# Patient Record
Sex: Male | Born: 1970 | Race: Black or African American | Hispanic: No | Marital: Married | Smoking: Never smoker
Health system: Southern US, Community
[De-identification: ages and names within clinical notes are randomized; demographics above are authoritative.]

## PROBLEM LIST (undated history)

## (undated) ENCOUNTER — Ambulatory Visit: Admission: EM | Payer: BC Managed Care – PPO

## (undated) DIAGNOSIS — Z9989 Dependence on other enabling machines and devices: Principal | ICD-10-CM

## (undated) DIAGNOSIS — G4733 Obstructive sleep apnea (adult) (pediatric): Secondary | ICD-10-CM

## (undated) DIAGNOSIS — I10 Essential (primary) hypertension: Secondary | ICD-10-CM

## (undated) DIAGNOSIS — E669 Obesity, unspecified: Secondary | ICD-10-CM

## (undated) HISTORY — DX: Essential (primary) hypertension: I10

## (undated) HISTORY — DX: Obstructive sleep apnea (adult) (pediatric): G47.33

## (undated) HISTORY — DX: Obesity, unspecified: E66.9

## (undated) HISTORY — DX: Dependence on other enabling machines and devices: Z99.89

## (undated) HISTORY — PX: NASAL SEPTUM SURGERY: SHX37

## (undated) HISTORY — PX: COLONOSCOPY: SHX174

---

## 2004-01-07 ENCOUNTER — Emergency Department (HOSPITAL_COMMUNITY): Admission: EM | Admit: 2004-01-07 | Discharge: 2004-01-07 | Payer: Self-pay | Admitting: Family Medicine

## 2004-02-07 ENCOUNTER — Emergency Department (HOSPITAL_COMMUNITY): Admission: EM | Admit: 2004-02-07 | Discharge: 2004-02-07 | Payer: Self-pay | Admitting: Emergency Medicine

## 2004-03-01 ENCOUNTER — Emergency Department (HOSPITAL_COMMUNITY): Admission: EM | Admit: 2004-03-01 | Discharge: 2004-03-01 | Payer: Self-pay | Admitting: Family Medicine

## 2004-08-10 ENCOUNTER — Ambulatory Visit (HOSPITAL_COMMUNITY): Admission: RE | Admit: 2004-08-10 | Discharge: 2004-08-10 | Payer: Self-pay | Admitting: Gastroenterology

## 2004-08-10 ENCOUNTER — Encounter (INDEPENDENT_AMBULATORY_CARE_PROVIDER_SITE_OTHER): Payer: Self-pay | Admitting: Specialist

## 2004-11-29 ENCOUNTER — Ambulatory Visit: Payer: Self-pay | Admitting: Internal Medicine

## 2004-11-29 ENCOUNTER — Ambulatory Visit (HOSPITAL_BASED_OUTPATIENT_CLINIC_OR_DEPARTMENT_OTHER): Admission: RE | Admit: 2004-11-29 | Discharge: 2004-11-29 | Payer: Self-pay | Admitting: Family Medicine

## 2006-10-12 ENCOUNTER — Ambulatory Visit: Payer: Self-pay | Admitting: Pulmonary Disease

## 2007-01-24 ENCOUNTER — Ambulatory Visit: Payer: Self-pay | Admitting: Pulmonary Disease

## 2007-07-09 DIAGNOSIS — I10 Essential (primary) hypertension: Secondary | ICD-10-CM | POA: Insufficient documentation

## 2007-07-09 DIAGNOSIS — G4733 Obstructive sleep apnea (adult) (pediatric): Secondary | ICD-10-CM

## 2007-08-21 ENCOUNTER — Ambulatory Visit: Payer: Self-pay | Admitting: Pulmonary Disease

## 2007-10-06 ENCOUNTER — Emergency Department (HOSPITAL_COMMUNITY): Admission: EM | Admit: 2007-10-06 | Discharge: 2007-10-07 | Payer: Self-pay | Admitting: Family Medicine

## 2009-05-26 ENCOUNTER — Ambulatory Visit: Payer: Self-pay | Admitting: Internal Medicine

## 2009-05-26 ENCOUNTER — Encounter (INDEPENDENT_AMBULATORY_CARE_PROVIDER_SITE_OTHER): Payer: Self-pay | Admitting: Otolaryngology

## 2009-05-26 ENCOUNTER — Ambulatory Visit (HOSPITAL_COMMUNITY): Admission: RE | Admit: 2009-05-26 | Discharge: 2009-05-27 | Payer: Self-pay | Admitting: Otolaryngology

## 2009-06-21 ENCOUNTER — Emergency Department (HOSPITAL_COMMUNITY): Admission: EM | Admit: 2009-06-21 | Discharge: 2009-06-21 | Payer: Self-pay | Admitting: Emergency Medicine

## 2009-07-01 ENCOUNTER — Observation Stay (HOSPITAL_COMMUNITY): Admission: EM | Admit: 2009-07-01 | Discharge: 2009-07-02 | Payer: Self-pay | Admitting: Emergency Medicine

## 2009-07-22 ENCOUNTER — Inpatient Hospital Stay (HOSPITAL_COMMUNITY): Admission: RE | Admit: 2009-07-22 | Discharge: 2009-07-24 | Payer: Self-pay | Admitting: Psychiatry

## 2009-07-22 ENCOUNTER — Ambulatory Visit: Payer: Self-pay | Admitting: Psychiatry

## 2009-07-26 ENCOUNTER — Other Ambulatory Visit (HOSPITAL_COMMUNITY): Admission: RE | Admit: 2009-07-26 | Discharge: 2009-10-24 | Payer: Self-pay | Admitting: Psychiatry

## 2009-07-26 ENCOUNTER — Ambulatory Visit: Payer: Self-pay | Admitting: Psychiatry

## 2009-09-24 ENCOUNTER — Emergency Department (HOSPITAL_COMMUNITY): Admission: EM | Admit: 2009-09-24 | Discharge: 2009-09-24 | Payer: Self-pay | Admitting: Family Medicine

## 2009-12-16 ENCOUNTER — Emergency Department (HOSPITAL_COMMUNITY): Admission: EM | Admit: 2009-12-16 | Discharge: 2009-12-16 | Payer: Self-pay | Admitting: Family Medicine

## 2010-08-11 ENCOUNTER — Ambulatory Visit (HOSPITAL_BASED_OUTPATIENT_CLINIC_OR_DEPARTMENT_OTHER): Payer: 59 | Attending: Otolaryngology

## 2010-08-11 DIAGNOSIS — G4733 Obstructive sleep apnea (adult) (pediatric): Secondary | ICD-10-CM | POA: Insufficient documentation

## 2010-08-14 DIAGNOSIS — G4733 Obstructive sleep apnea (adult) (pediatric): Secondary | ICD-10-CM

## 2010-08-28 LAB — BASIC METABOLIC PANEL
CO2: 25 mEq/L (ref 19–32)
Chloride: 108 mEq/L (ref 96–112)
Potassium: 3.3 mEq/L — ABNORMAL LOW (ref 3.5–5.1)

## 2010-08-28 LAB — POCT I-STAT, CHEM 8
Chloride: 105 mEq/L (ref 96–112)
Creatinine, Ser: 1.3 mg/dL (ref 0.4–1.5)
Glucose, Bld: 107 mg/dL — ABNORMAL HIGH (ref 70–99)
HCT: 36 % — ABNORMAL LOW (ref 39.0–52.0)
Hemoglobin: 12.2 g/dL — ABNORMAL LOW (ref 13.0–17.0)
Potassium: 3.5 mEq/L (ref 3.5–5.1)
Sodium: 143 mEq/L (ref 135–145)

## 2010-08-28 LAB — CBC
HCT: 24.7 % — ABNORMAL LOW (ref 39.0–52.0)
HCT: 29.9 % — ABNORMAL LOW (ref 39.0–52.0)
Hemoglobin: 8.1 g/dL — ABNORMAL LOW (ref 13.0–17.0)
Hemoglobin: 9.7 g/dL — ABNORMAL LOW (ref 13.0–17.0)
MCV: 80 fL (ref 78.0–100.0)
Platelets: 325 10*3/uL (ref 150–400)
RBC: 3.08 MIL/uL — ABNORMAL LOW (ref 4.22–5.81)
RBC: 3.74 MIL/uL — ABNORMAL LOW (ref 4.22–5.81)
WBC: 15.5 10*3/uL — ABNORMAL HIGH (ref 4.0–10.5)
WBC: 16.8 10*3/uL — ABNORMAL HIGH (ref 4.0–10.5)

## 2010-08-28 LAB — ABO/RH: ABO/RH(D): O POS

## 2010-08-28 LAB — DIFFERENTIAL
Eosinophils Absolute: 0.3 10*3/uL (ref 0.0–0.7)
Eosinophils Relative: 2 % (ref 0–5)
Lymphocytes Relative: 21 % (ref 12–46)
Lymphs Abs: 3.5 10*3/uL (ref 0.7–4.0)
Monocytes Absolute: 1.3 10*3/uL — ABNORMAL HIGH (ref 0.1–1.0)
Monocytes Relative: 8 % (ref 3–12)

## 2010-08-28 LAB — TYPE AND SCREEN: ABO/RH(D): O POS

## 2010-09-01 LAB — COMPREHENSIVE METABOLIC PANEL
Albumin: 3.8 g/dL (ref 3.5–5.2)
BUN: 8 mg/dL (ref 6–23)
Calcium: 9.5 mg/dL (ref 8.4–10.5)
Chloride: 106 mEq/L (ref 96–112)
Creatinine, Ser: 1.34 mg/dL (ref 0.4–1.5)
GFR calc non Af Amer: 60 mL/min — ABNORMAL LOW (ref >60)
Total Bilirubin: 0.8 mg/dL (ref 0.3–1.2)

## 2010-09-01 LAB — CBC
HCT: 30.8 % — ABNORMAL LOW (ref 39.0–52.0)
Hemoglobin: 9.9 g/dL — ABNORMAL LOW (ref 13.0–17.0)
MCV: 77.7 fL — ABNORMAL LOW (ref 78.0–100.0)
RDW: 18.6 % — ABNORMAL HIGH (ref 11.5–15.5)

## 2010-09-13 LAB — BASIC METABOLIC PANEL
Chloride: 100 mEq/L (ref 96–112)
GFR calc non Af Amer: >60 mL/min (ref >60)
Glucose, Bld: 94 mg/dL (ref 70–99)
Potassium: 4.3 mEq/L (ref 3.5–5.1)
Sodium: 138 mEq/L (ref 135–145)

## 2010-09-13 LAB — CBC
Hemoglobin: 13.8 g/dL (ref 13.0–17.0)
RBC: 5.45 MIL/uL (ref 4.22–5.81)

## 2010-10-28 NOTE — Procedures (Signed)
NAME:  [name], [name]                ACCOUNT NO.:  [id number]   MEDICAL RECORD NO.:  [id number]          PATIENT TYPE:  OUT   LOCATION:  SLEEP CENTER                 FACILITY:  [facility]   PHYSICIAN:  Clinton D. Maple Hudson, M.D. DATE OF BIRTH:  Nov 07, 1970   DATE OF STUDY:  11/29/2004                              NOCTURNAL POLYSOMNOGRAM   REFERRING PHYSICIAN:  Dr. Bryan Lemma. Ehinger.   INDICATION FOR STUDY:  Hypersomnia with sleep apnea.   EPWORTH SLEEPINESS SCORE:  11/24   BODY MASS INDEX:  45   WEIGHT:  327 pounds.   SLEEP ARCHITECTURE:  Total sleep time of 403 minutes with sleep efficiency  90%.  Stage I was 16%, stage II 46%, stages III and IV 2%, REM 35% of total  sleep time.  Sleep latency 2.5 minutes, REM latency 79 minutes, awake after  sleep onset 41 minutes, arousal index 22.  No bedtime medication was  reported.   RESPIRATORY DATA:   SPLIT-STUDY PROTOCOL:  Respiratory disturbance index (RDI, AHI) -- 81.8  obstructive events per hour, indicating severe obstructive sleep  apnea/hypopnea syndrome.  There were 48 obstructive apneas, 1 central apnea  and 133 hypopneas before CPAP.  Events were not positional.  CPAP was  titrated to 14 CWP, RDI of 4.2 per hour, using a small Respironics Comfort  Gel mask with heated humidifier.   OXYGEN DATA:  Loud snoring with oxygen desaturation to a nadir of 73% before  CPAP.  After CPAP control, saturation above 95% to 98% on room air.   CARDIAC DATA:  Normal sinus rhythm.   MOVEMENT/PARASOMNIA:  Occasional leg jerk with insignificant effect on  sleep.   IMPRESSION/RECOMMENDATION:  1.  Severe obstructive sleep apnea/hypopnea syndrome, respiratory      disturbance index 81.8 per hour with loud snoring and oxygen      desaturation to 73%.  2.  Successful continuous positive airway pressure titration to 14      centimeters of water pressure, respiratory disturbance index 4.2 per      hour, using a small Comfort Gel mask with heated  humidifier.      Clinton D. Maple Hudson, M.D.  Diplomat    CDY/MEDQ  D:  12/04/2004 14:25:39  T:  12/05/2004 06:18:18  Job:  [id number]

## 2010-10-28 NOTE — Op Note (Signed)
[name], [name]                ACCOUNT NO.:  [id number]   MEDICAL RECORD NO.:  [id number]          PATIENT TYPE:  AMB   LOCATION:                               FACILITY:  [facility]   PHYSICIAN:  Graylin Shiver, M.D.   DATE OF BIRTH:  04/03/1971   DATE OF PROCEDURE:  08/10/2004  DATE OF DISCHARGE:                                 OPERATIVE REPORT   INDICATIONS:  Rectal bleeding.   INFORMED CONSENT:  Informed consent was obtained after explanation of the  risks of bleeding, infection and perforation.   PREMEDICATION:  Fentanyl 100 mcg IV, Versed 8 mg IV.   PROCEDURE:  With the patient in the left lateral decubitus position, a  rectal exam was performed. No masses were felt. The Olympus colonoscope was  inserted into the rectum and advanced around the colon to the cecum. Cecal  landmarks were identified.  The cecum and ascending colon were normal, the  transverse colon normal, the descending colon normal.  In the sigmoid, there  was a small 3 mm polyp biopsied off with cold forceps. The rectum was  normal. The scope was retroflexed in the rectum. No abnormalities were seen.  The scope was straightened and brought out. The patient tolerated the  procedure well without complications.   IMPRESSION:  Small sigmoid polyp.   PLAN:  The pathology will be checked.   I suspect that this patient's intermittent rectal bleeding is secondary to  some irritation around the anal area associated with hard bowel movements.  I see no specific lesions at this time.  I would recommend that he use a  fiber supplement such as Metamucil to see if this helps.       ___________________________________________  Graylin Shiver, M.D.    SFG/MEDQ  D:  08/10/2004  T:  08/10/2004  Job:  [id number]   cc:   Kyung Rudd  [address]  [place]  [place] [address]  Fax: [phone]

## 2010-10-28 NOTE — Assessment & Plan Note (Signed)
[facility]                             PULMONARY OFFICE NOTE   DARIEN, KADING                         MRN:          [id number]  DATE:10/12/2006                            DOB:          1971/06/01    SLEEP MEDICINE CONSULTATION   HISTORY OF PRESENT ILLNESS:  The patient is a 40 year old gentleman whom  I have been asked to see for management of obstructive sleep apnea.  The  patient was diagnosed with severe sleep apnea in 2006 when he was found  to have 82 events per hour and optimal CPAP at 14 cm.  Since that time,  the patient has used CPAP off and on and is complaining of too high a  pressure and a feeling of suffocation.  Recently, he has had his  pressure decreased with some improvement, but it remains quite difficult  for him.  The patient still awakens during the night and will rip the  mask off, and feels that he is intolerant to it.  The patient currently  uses a full face mask and does admit to possible mouth opening.  He has  a heated humidifier and knows how to use it.  He typically gets to bed  at various hours, but usually around 12 midnight.  He gets up at 6:30 to  start his day.  He has been noted to have snoring and pauses in his  breathing during his sleep when he does not wear CPAP.  The patient  feels fairly rested in the morning and denies any inappropriate daytime  sleepiness.  However, the patient works for [employer] and stays moving  continually throughout the day.  He denies dozing with movies or TV and  denies any sleepiness with driving.  He does state that his weight is up  about 40 pounds over the last few years.   PAST MEDICAL HISTORY:  1. Significant for hypertension.  2. Sleep apnea, as stated above.  Otherwise, noncontributory.   MEDICATIONS:  Hydrochlorothiazide 25 mg daily.   He has no known drug allergies.   SOCIAL HISTORY:  He is married.  He has never smoked.   FAMILY HISTORY:  Noncontributory in first  degree relatives.   REVIEW OF SYSTEMS:  As per history of present illness.  Also see patient  intake form documented in the chart.   PHYSICAL EXAM:  GENERAL:  He is a morbidly obese male in no acute  distress.  Blood pressure is 148/98, pulse 58, temperature 98.2, weight 314 pounds,  O2 saturation on room air is 95%.  HEENT:  Pupils are equal, round, and reactive to light and  accommodation.  Extraocular muscles are intact.  Nares show turbinate  hypertrophy with severe elongation of the soft palate and uvula, and  tonsillar hypertrophy as well.  NECK:  Supple without JVD or lymphadenopathy.  There is no palpable  thyromegaly.  CHEST:  Totally clear.  CARDIAC:  Regular rate and rhythm.  No murmurs, rubs, or gallops.  ABDOMEN:  Soft and nontender.  Good bowel sounds.  GENITAL, RECTAL, AND BREASTS:  Not done  and not indicated.  LOWER EXTREMITIES:  Trace edema.  Pulses are intact distally.  NEUROLOGIC:  He is alert and oriented.  No obvious motor deficits.   IMPRESSION:  Severe obstructive sleep apnea with poor tolerance of  continuous positive airway pressure.  I wonder if he would do better  with a change to BiPAP to help him with his smothering sensation.  I  also think changing from a full face mask to nasal pillows with a chin  cuff would also help.  The patient feels that he is not symptomatic  during the day, but I have explained to him that with this severity of  sleep apnea, that he is at very high risk for cardiovascular  complications moving forward.  The patient is agreeable to trying this.   PLAN:  1. Will change to BiPAP with an inspiratory pressure of 12 and an      expiratory pressure of 8.  Obviously, this is not going to be high      enough for him, but will try to improve his tolerance and do      desensitization.  2. Change to Respironics OptiLife nasal pillows.  3. The patient will follow up in 4 weeks or sooner if there are      problems.     Barbaraann Share, MD,FCCP  Electronically Signed    [facility]/MedQ  DD: 10/27/2006  DT: 10/27/2006  Job #: [id number]   cc:   Bryan Lemma. Manus Gunning, M.D.

## 2010-11-25 ENCOUNTER — Other Ambulatory Visit (INDEPENDENT_AMBULATORY_CARE_PROVIDER_SITE_OTHER): Payer: Self-pay | Admitting: General Surgery

## 2010-11-28 ENCOUNTER — Other Ambulatory Visit (INDEPENDENT_AMBULATORY_CARE_PROVIDER_SITE_OTHER): Payer: Self-pay | Admitting: General Surgery

## 2010-11-28 LAB — LIPID PANEL
Cholesterol: 161 mg/dL (ref 0–200)
Total CHOL/HDL Ratio: 4.6 Ratio

## 2010-11-28 LAB — COMPREHENSIVE METABOLIC PANEL
ALT: 20 U/L (ref 0–53)
BUN: 15 mg/dL (ref 6–23)
CO2: 27 mEq/L (ref 19–32)
Calcium: 10 mg/dL (ref 8.4–10.5)
Chloride: 102 mEq/L (ref 96–112)
Creat: 1.27 mg/dL (ref 0.50–1.35)

## 2010-11-28 LAB — TSH: TSH: 1.569 u[IU]/mL (ref 0.350–4.500)

## 2010-11-29 LAB — CBC WITH DIFFERENTIAL/PLATELET
Eosinophils Relative: 4 % (ref 0–5)
HCT: 45.1 % (ref 39.0–52.0)
Lymphocytes Relative: 15 % (ref 12–46)
Lymphs Abs: 1.9 10*3/uL (ref 0.7–4.0)
MCV: 74.3 fL — ABNORMAL LOW (ref 78.0–100.0)
Monocytes Absolute: 1.1 10*3/uL — ABNORMAL HIGH (ref 0.1–1.0)
Monocytes Relative: 8 % (ref 3–12)
RBC: 6.07 MIL/uL — ABNORMAL HIGH (ref 4.22–5.81)
WBC: 13.2 10*3/uL — ABNORMAL HIGH (ref 4.0–10.5)

## 2010-11-30 ENCOUNTER — Ambulatory Visit (HOSPITAL_COMMUNITY)
Admission: RE | Admit: 2010-11-30 | Discharge: 2010-11-30 | Disposition: A | Discharge status: Home / Self Care | Payer: 59 | Source: Ambulatory Visit | Attending: General Surgery | Admitting: General Surgery

## 2010-11-30 DIAGNOSIS — Z01812 Encounter for preprocedural laboratory examination: Secondary | ICD-10-CM | POA: Insufficient documentation

## 2010-11-30 DIAGNOSIS — Z01818 Encounter for other preprocedural examination: Secondary | ICD-10-CM | POA: Insufficient documentation

## 2010-11-30 DIAGNOSIS — N200 Calculus of kidney: Secondary | ICD-10-CM | POA: Insufficient documentation

## 2010-11-30 DIAGNOSIS — Z01810 Encounter for preprocedural cardiovascular examination: Secondary | ICD-10-CM | POA: Insufficient documentation

## 2010-12-12 ENCOUNTER — Ambulatory Visit: Payer: 59 | Admitting: *Deleted

## 2010-12-13 ENCOUNTER — Encounter: Payer: 59 | Attending: General Surgery | Admitting: *Deleted

## 2010-12-13 DIAGNOSIS — Z01818 Encounter for other preprocedural examination: Secondary | ICD-10-CM | POA: Insufficient documentation

## 2010-12-13 DIAGNOSIS — Z713 Dietary counseling and surveillance: Secondary | ICD-10-CM | POA: Insufficient documentation

## 2011-02-16 ENCOUNTER — Encounter: Payer: 59 | Attending: General Surgery | Admitting: *Deleted

## 2011-02-16 DIAGNOSIS — Z09 Encounter for follow-up examination after completed treatment for conditions other than malignant neoplasm: Secondary | ICD-10-CM | POA: Insufficient documentation

## 2011-02-16 DIAGNOSIS — Z9884 Bariatric surgery status: Secondary | ICD-10-CM | POA: Insufficient documentation

## 2011-02-16 DIAGNOSIS — Z713 Dietary counseling and surveillance: Secondary | ICD-10-CM | POA: Insufficient documentation

## 2011-02-16 NOTE — Progress Notes (Signed)
  Pre-Operative Nutrition Class  Patient was seen on 02/16/2011 from 5:00pm to 6:00pm for Pre-Operative Nutrition education at [facility].   Surgery date: 03/07/11 Surgery type: LAGB  Weight today: 375.7 lb Weight change: 7.5 lbs gain since 12/13/10 Total weight lost: n/a BMI: 51.1  Samples given per MNT protocol: Bariatric Advantage Multivitamin Lot # [id number] Exp:12/12  Bariatric Advantage Calcium Citrate Lot # [id number] Exp:12/13  Celebrate Vitamins Multivitamin Lot # [id number] Exp: 4/13  Celebrate VitaminsCalcium Citrate Lot # [id number] Exp: 4/13  Corliss MarcusToni Arthurs Lot #: [id number] Exp: 1/14  The following the learning objective met by the patient during this course:   Identifies Pre-Op Dietary Goals and will begin 2 weeks pre-operatively   Identifies appropriate sources of fluids and proteins   States protein recommendations and appropriate sources pre and post-operatively  Identifies Post-Operative Dietary Goals and will follow for 2 weeks post-operatively  Identifies appropriate multivitamin and calcium sources  Describes the need for physical activity post-operatively and will follow MD recommendations  States when to call healthcare provider regarding medication questions or post-operative complications  Handouts given during class include:  Pre-Op Bariatric Surgery Diet Handout  Protein Shake Handout  Post-Op Bariatric Surgery Nutrition Handout  BELT Program Information Flyer  Support Group Information Flyer  Follow-Up Plan: Patient will follow-up at [facility] 2 weeks post operatively for diet advancement per MD.

## 2011-02-16 NOTE — Patient Instructions (Signed)
Follow:    Pre-Op Diet per MD 2 weeks prior to surgery  Phase 2- Liquids (clear/full) 2 weeks after surgery  Vitamin/Mineral/Calcium guidelines for purchasing bariatric supplements  Exercise guidelines pre and post-op per MD  Follow-up at [facility] in 2 weeks post-op for diet advancement. Contact Orit Sanville as needed with questions/concerns.  

## 2011-02-17 ENCOUNTER — Encounter (INDEPENDENT_AMBULATORY_CARE_PROVIDER_SITE_OTHER): Payer: Self-pay | Admitting: General Surgery

## 2011-02-17 ENCOUNTER — Ambulatory Visit (INDEPENDENT_AMBULATORY_CARE_PROVIDER_SITE_OTHER): Payer: 59 | Admitting: General Surgery

## 2011-02-17 NOTE — Patient Instructions (Signed)
Follow your preop diet closely. Call us for any questions or concerns.

## 2011-02-17 NOTE — Progress Notes (Signed)
Patient returns for his preop visit prior to planned LAP-BAND placement. We reviewed his workup. There were no concerns on-site or nutrition evaluation. Upper GI series showed a couple of renal stones but no hiatal hernia or other gastric abnormality. Lab work showed a mildly elevated white blood count of 13,000 but otherwise negative. He has not had any fever or constitutional symptoms.  All his questions and his wife's questions were answered and is reviewed the consent form. He encouraged to stay closely to the preop diet. He is ready for surgery later this month.

## 2011-02-26 ENCOUNTER — Observation Stay (HOSPITAL_COMMUNITY)
Admission: EM | Admit: 2011-02-26 | Discharge: 2011-02-26 | Disposition: A | Discharge status: Home / Self Care | Payer: 59 | Attending: Emergency Medicine | Admitting: Emergency Medicine

## 2011-02-26 DIAGNOSIS — G4730 Sleep apnea, unspecified: Secondary | ICD-10-CM | POA: Insufficient documentation

## 2011-02-26 DIAGNOSIS — I10 Essential (primary) hypertension: Secondary | ICD-10-CM | POA: Insufficient documentation

## 2011-02-26 DIAGNOSIS — N200 Calculus of kidney: Principal | ICD-10-CM | POA: Insufficient documentation

## 2011-02-26 DIAGNOSIS — R112 Nausea with vomiting, unspecified: Secondary | ICD-10-CM | POA: Insufficient documentation

## 2011-02-26 LAB — URINALYSIS, ROUTINE W REFLEX MICROSCOPIC
Glucose, UA: NEGATIVE mg/dL
Leukocytes, UA: NEGATIVE
Nitrite: NEGATIVE
Protein, ur: 100 mg/dL — AB

## 2011-02-26 LAB — POCT I-STAT, CHEM 8
BUN: 24 mg/dL — ABNORMAL HIGH (ref 6–23)
Calcium, Ion: 1.13 mmol/L (ref 1.12–1.32)
Chloride: 102 mEq/L (ref 96–112)

## 2011-02-26 LAB — URINE MICROSCOPIC-ADD ON

## 2011-03-03 ENCOUNTER — Other Ambulatory Visit (INDEPENDENT_AMBULATORY_CARE_PROVIDER_SITE_OTHER): Payer: Self-pay | Admitting: General Surgery

## 2011-03-03 ENCOUNTER — Encounter (HOSPITAL_COMMUNITY): Payer: 59

## 2011-03-03 LAB — DIFFERENTIAL
Basophils Absolute: 0 10*3/uL (ref 0.0–0.1)
Basophils Relative: 0 % (ref 0–1)
Eosinophils Absolute: 0.6 10*3/uL (ref 0.0–0.7)
Eosinophils Relative: 5 % (ref 0–5)
Lymphocytes Relative: 16 % (ref 12–46)

## 2011-03-03 LAB — SURGICAL PCR SCREEN
MRSA, PCR: NEGATIVE
Staphylococcus aureus: POSITIVE — AB

## 2011-03-03 LAB — COMPREHENSIVE METABOLIC PANEL
ALT: 25 U/L (ref 0–53)
AST: 21 U/L (ref 0–37)
Albumin: 3.8 g/dL (ref 3.5–5.2)
Alkaline Phosphatase: 63 U/L (ref 39–117)
Chloride: 99 mEq/L (ref 96–112)
Potassium: 3.6 mEq/L (ref 3.5–5.1)
Sodium: 137 mEq/L (ref 135–145)
Total Bilirubin: 0.5 mg/dL (ref 0.3–1.2)
Total Protein: 8 g/dL (ref 6.0–8.3)

## 2011-03-03 LAB — CBC
HCT: 40 % (ref 39.0–52.0)
Platelets: 343 10*3/uL (ref 150–400)
RDW: 17.2 % — ABNORMAL HIGH (ref 11.5–15.5)
WBC: 10.3 10*3/uL (ref 4.0–10.5)

## 2011-03-06 ENCOUNTER — Other Ambulatory Visit (INDEPENDENT_AMBULATORY_CARE_PROVIDER_SITE_OTHER): Payer: Self-pay | Admitting: General Surgery

## 2011-03-06 DIAGNOSIS — R899 Unspecified abnormal finding in specimens from other organs, systems and tissues: Secondary | ICD-10-CM

## 2011-03-06 LAB — BASIC METABOLIC PANEL
Calcium: 9.9 mg/dL (ref 8.4–10.5)
Creat: 1.8 mg/dL — ABNORMAL HIGH (ref 0.50–1.35)

## 2011-03-07 ENCOUNTER — Ambulatory Visit (HOSPITAL_COMMUNITY)
Admission: RE | Admit: 2011-03-07 | Discharge: 2011-03-08 | Disposition: A | Discharge status: Home / Self Care | Payer: 59 | Source: Ambulatory Visit | Attending: General Surgery | Admitting: General Surgery

## 2011-03-07 DIAGNOSIS — Z01812 Encounter for preprocedural laboratory examination: Secondary | ICD-10-CM | POA: Insufficient documentation

## 2011-03-07 DIAGNOSIS — N200 Calculus of kidney: Secondary | ICD-10-CM | POA: Insufficient documentation

## 2011-03-07 DIAGNOSIS — Z6841 Body Mass Index (BMI) 40.0 and over, adult: Secondary | ICD-10-CM | POA: Insufficient documentation

## 2011-03-07 DIAGNOSIS — I10 Essential (primary) hypertension: Secondary | ICD-10-CM

## 2011-03-07 DIAGNOSIS — G4733 Obstructive sleep apnea (adult) (pediatric): Secondary | ICD-10-CM

## 2011-03-07 HISTORY — PX: LAPAROSCOPIC GASTRIC BANDING: SHX1100

## 2011-03-07 LAB — DIFFERENTIAL
Basophils Absolute: 0.1
Eosinophils Relative: 0
Lymphocytes Relative: 6 — ABNORMAL LOW
Lymphs Abs: 1.3
Monocytes Absolute: 0.6

## 2011-03-07 LAB — COMPREHENSIVE METABOLIC PANEL
AST: 24
Albumin: 3.8
Chloride: 101
Creatinine, Ser: 1.72 — ABNORMAL HIGH
GFR calc Af Amer: 55 — ABNORMAL LOW
Potassium: 3.8
Sodium: 139
Total Bilirubin: 0.3

## 2011-03-07 LAB — CBC
MCV: 79.3
Platelets: 388
WBC: 20 — ABNORMAL HIGH

## 2011-03-08 ENCOUNTER — Ambulatory Visit (HOSPITAL_COMMUNITY): Payer: 59

## 2011-03-08 LAB — DIFFERENTIAL
Eosinophils Relative: 3 % (ref 0–5)
Lymphocytes Relative: 13 % (ref 12–46)
Lymphs Abs: 1.4 10*3/uL (ref 0.7–4.0)
Monocytes Absolute: 1 10*3/uL (ref 0.1–1.0)

## 2011-03-08 LAB — BASIC METABOLIC PANEL
BUN: 16 mg/dL (ref 6–23)
Creatinine, Ser: 1.84 mg/dL — ABNORMAL HIGH (ref 0.50–1.35)
GFR calc Af Amer: 50 mL/min — ABNORMAL LOW (ref >60)
GFR calc non Af Amer: 41 mL/min — ABNORMAL LOW (ref >60)

## 2011-03-08 LAB — CBC
HCT: 37.1 % — ABNORMAL LOW (ref 39.0–52.0)
MCH: 23.5 pg — ABNORMAL LOW (ref 26.0–34.0)
MCHC: 31.3 g/dL (ref 30.0–36.0)
MCV: 75.3 fL — ABNORMAL LOW (ref 78.0–100.0)
RDW: 16.9 % — ABNORMAL HIGH (ref 11.5–15.5)

## 2011-03-13 NOTE — Op Note (Signed)
NAMEBRYNDEN, [name]                ACCOUNT NO.:  [id number]  MEDICAL RECORD NO.:  [id number]  LOCATION:  [facility]                         FACILITY:  [facility]  PHYSICIAN:  Sharlet Salina T. Shervon Kerwin, M.D.DATE OF BIRTH:  12-02-1970  DATE OF PROCEDURE:  03/07/2011 DATE OF DISCHARGE:                              OPERATIVE REPORT   PREOPERATIVE DIAGNOSIS:  Morbid obesity.  POSTOPERATIVE DIAGNOSIS:  Morbid obesity.  SURGICAL PROCEDURES:  Placement of laparoscopic adjustable gastric band.  SURGEON:  Lorne Skeens. Aalani Aikens, M.D.  ASSISTANT:  Thornton Park. Daphine Deutscher, MD  ANESTHESIA:  General.  BRIEF HISTORY:  This patient is a 40 year old male with a progressive history of morbid obesity, unresponsive to multiple attempts at medical management.  He presents at 6 feet, 368 pounds, BMI of 50, and comorbidities of obstructive sleep apnea and hypertension, on 2 medications.  After extensive preoperative discussion regarding alternatives, risks, and nature of the procedure detailed elsewhere, we have elected to proceed with placement of laparoscopic adjustable gastric band for treatment of his morbid obesity.  DESCRIPTION OF OPERATION:  The patient was brought to operating room, placed in supine position on the operating table and general orotracheal anesthesia was induced.  He received preoperative IV antibiotics. Heparin was given subcutaneously preoperatively.  The abdomen was widely sterilely prepped and draped, and correct patient and procedure were verified.  Local anesthesia was used to infiltrate the trocar sites. Access was obtained with a 11 mm OptiVu trocar in the left subcostal space without difficulty and pneumoperitoneum established.  There was no evidence of trocar injury.  Under direct vision, a 15 mm trocar was placed laterally in the right upper quadrant, a 11 mm trocar in the right upper quadrant more medially, and 11 mm trocar above the left umbilicus for the camera port, and a 5 mm  trocar laterally in the left upper quadrant.  Through a 5 mm subxiphoid site, the Nathanson retractor was placed, and left lobe of liver elevated with good exposure of the hiatus and upper stomach.  Initially, the left crus and angle of His were exposed.  The peritoneum overlying the left crus was incised and careful blunt dissection carried back down along the left crus toward the retrogastric area.  The gastrohepatic omentum was opened along a relatively clear area, although this was very fatty.  The right crus was then exposed and the base of the right crus at the area of crossing fat was identified.  The peritoneum here was incised and the finger retractor was then inserted into this space, and passed retrogastric without difficulty and deployed up through the previously dissected area of the angle of His without difficulty.  An AP large flush band system was introduced, tubing placed and the finger retractor brought back behind the stomach, and then the band brought through the retrogastric tunnel without difficulty.  With the sizing tube in place, the band was buckled with no undue tension and the sizing tube removed.  Holding the tubing toward the patient's feet, the fundus was then imbricated up over the band to the small gastric pouch with 3 interrupted 2-0 Ethibond sutures.  The band appeared to be in excellent position.  There was no  bleeding or evidence of intestinal or trocar injury.  The Nathanson retractor was removed.  The tubing was brought out through the right mid abdominal port, and then all CO2 was evacuated and trocars removed.  The right mid abdominal incision was lengthened slightly and subcutaneous pocket created.  The tubing was cut and attached to the port to which a piece of Prolene mesh was sutured to the back and the port was placed subcutaneously just lateral to the incision with the tubing curving smoothly into the abdomen.  This site was closed with a  running subcutaneous 3-0 Vicryl, and all skin incisions closed with subcuticular Monocryl and Dermabond.  Sponge and needle counts correct.  The patient taken to recovery in good condition.     Lorne Skeens. Rufino Staup, M.D.     Tory Emerald  D:  03/07/2011  T:  03/07/2011  Job:  [id number]  Electronically Signed by Glenna Fellows M.D. on 03/13/2011 01:36:31 PM

## 2011-03-21 ENCOUNTER — Encounter: Payer: 59 | Attending: General Surgery

## 2011-03-21 DIAGNOSIS — Z01818 Encounter for other preprocedural examination: Secondary | ICD-10-CM | POA: Insufficient documentation

## 2011-03-21 DIAGNOSIS — Z713 Dietary counseling and surveillance: Secondary | ICD-10-CM | POA: Insufficient documentation

## 2011-03-21 NOTE — Patient Instructions (Signed)
Patient to follow Phase 3A-Soft, High Protein Diet and follow-up at [facility] in 6 weeks for 2 months post-op nutrition visit for diet advancement. 

## 2011-03-21 NOTE — Progress Notes (Signed)
  2 Week Post-Operative Nutrition Class  Patient was seen on 03/21/2011 for Post-Operative Nutrition education from 1600 to 1635 at the [facility].   Surgery date: 03/07/11  Surgery type: LAGB   Weight today: 334.2 Weight change: 41.5 lbs BMI: 45.4%  The following learning objectives were reviewed in this course:   Identifies Phase 3A (Soft, High Proteins) Dietary Goals and will begin from 2 weeks post-operatively to 2 months post-operatively   Identifies appropriate sources of fluids and proteins   States protein recommendations and appropriate sources post-operatively  Identifies the need for appropriate texture modifications, mastication, and bite sizes when consuming solids  Identifies appropriate multivitamin and calcium sources post-operatively   BMI: 51.1Describes the need for physical activity post-operatively and will follow MD recommendations  States when to call healthcare provider regarding medication questions or post-operative complications  Handouts given during class include:  Phase 3A: Soft, High Protein Diet Handout  Band Fill Guidelines Handout  Follow-Up Plan: Patient will follow-up at [facility] in 6 weeks for 2 months post-op nutrition visit for diet advancement per MD.

## 2011-03-24 ENCOUNTER — Encounter (INDEPENDENT_AMBULATORY_CARE_PROVIDER_SITE_OTHER): Payer: Self-pay | Admitting: General Surgery

## 2011-03-24 ENCOUNTER — Ambulatory Visit (INDEPENDENT_AMBULATORY_CARE_PROVIDER_SITE_OTHER): Payer: 59 | Admitting: General Surgery

## 2011-03-24 VITALS — BP 122/78 | HR 60 | Temp 96.8°F | Resp 16 | Ht 71.75 in | Wt 341.2 lb

## 2011-03-24 DIAGNOSIS — Z9889 Other specified postprocedural states: Secondary | ICD-10-CM

## 2011-03-24 NOTE — Patient Instructions (Signed)
Work toward 40 minutes of exercise 4 times per week. Practice eating small bites very slowly. Did not preclude [place] for 30 minutes before or one hour after eating.

## 2011-03-24 NOTE — Progress Notes (Signed)
Patient returns to weeks following placement of lap band. He is doing well without complaints. He is tolerating a solid foods and does feel some early satiety.  On examination his weight is 341 pounds which is down 3 pounds from preop and down over 30 pounds from his original presentation. Abdomen is soft and nontender incisions all healing well.  Assessment and plan: Doing well following lab and placed without complication. Review diet and exercise strategies appeared return in 2-3 weeks for an initial fill

## 2011-03-30 ENCOUNTER — Encounter (INDEPENDENT_AMBULATORY_CARE_PROVIDER_SITE_OTHER): Payer: Self-pay | Admitting: General Surgery

## 2011-04-14 ENCOUNTER — Ambulatory Visit (INDEPENDENT_AMBULATORY_CARE_PROVIDER_SITE_OTHER): Payer: 59 | Admitting: General Surgery

## 2011-04-14 ENCOUNTER — Encounter (INDEPENDENT_AMBULATORY_CARE_PROVIDER_SITE_OTHER): Payer: Self-pay | Admitting: General Surgery

## 2011-04-14 NOTE — Progress Notes (Signed)
Patient returns just over one month following lap band placement. He is getting along well without complaint. He is not feeling much restriction if any at this point.  On exam his weight is stable at 341 pounds. Abdomen is soft and nontender port site looks fine.  With this information we went ahead with an initial fill of 2.0 cc finding 1.5 cc in his band for a total of 3.5 cc. He was able to tolerate water well. We discussed diet and exercise strategies. Return in 4 weeks.

## 2011-04-14 NOTE — Patient Instructions (Signed)
Concentrate on eating slowly with small bites. Protein shakes only this weekend.

## 2011-05-02 ENCOUNTER — Encounter: Payer: 59 | Attending: General Surgery | Admitting: *Deleted

## 2011-05-02 ENCOUNTER — Encounter: Payer: Self-pay | Admitting: *Deleted

## 2011-05-02 DIAGNOSIS — Z01818 Encounter for other preprocedural examination: Secondary | ICD-10-CM | POA: Insufficient documentation

## 2011-05-02 DIAGNOSIS — Z713 Dietary counseling and surveillance: Secondary | ICD-10-CM | POA: Insufficient documentation

## 2011-05-02 NOTE — Progress Notes (Signed)
  Follow-up visit: 8 Weeks Post-Operative LAGB Surgery  Medical Nutrition Therapy:  Appt start time: 1455 end time:  1530.  Assessment:  Primary concerns today: post-operative bariatric surgery nutrition management. [name] reports that he is doing better with portion control since his last band fill on 11/2. He continues to eat larger portions yet hopes for his 2nd fill soon.  Weight today: 341 lbs Weight change: 6.8 lb gain Total weight lost: 34.7 lbs total lost BMI: 46.3 Weight goal: 225 lbs  Surgery date: 03/07/11 Start weight at [facility]: 375.7 lbs  24-hr recall:  B (7 AM): cheese stick, Protein Shake (1) Snk (AM): No snack   L (10 AM): Protein shake (1) Snk (2 PM): Cheese stick, fruit snack  D (6 PM): Chicken (4 oz), green beans Snk (9-10 PM): SF icee or SF popsickle  Fluid intake: water, crystal light, protein shake = 64 oz Estimated total protein intake: 75-80g protein  Medications: No changes Supplementation: Taking supplements on a strict routine  Using straws: No Drinking while eating: No Hair loss: No Carbonated beverages: No N/V/D/C: None reported; Occasional diarrhea Last Lap-Band fill: Pt has had one band fill (1.5 cc) per Dr. Johna Sheriff  Recent physical activity:  Walking, 3 times/week, 45-60 minutes  Progress Towards Goal(s):  In progress.  Handouts given during visit include:  Phase 3B - High Protein and Non-starchy vegetables   Nutritional Diagnosis:  [place]-3.3 Overweight/obesity As related to recent LAGB surgery.  As evidenced by pt continuing to follow LAGB nutrition guidelines for continued weight loss.    Intervention:  Nutrition education.  Monitoring/Evaluation:  Dietary intake, exercise, lap band fills, and body weight. Follow up in 6-8 months for 3-4 month post-op visit.

## 2011-05-02 NOTE — Patient Instructions (Signed)
Goals:  Follow Phase 3B: High Protein + Non-Starchy Vegetables  Eat 3-6 small meals/snacks, every 3-5 hrs  Increase lean protein foods to meet 80-120g goal  Increase fluid intake to 64oz +  Avoid drinking 15 minutes before, during and 30 minutes after eating  Aim for >30 min of physical activity daily

## 2011-05-19 ENCOUNTER — Encounter (INDEPENDENT_AMBULATORY_CARE_PROVIDER_SITE_OTHER): Payer: 59 | Admitting: General Surgery

## 2011-05-26 ENCOUNTER — Encounter (INDEPENDENT_AMBULATORY_CARE_PROVIDER_SITE_OTHER): Payer: Self-pay | Admitting: General Surgery

## 2011-06-20 ENCOUNTER — Ambulatory Visit: Payer: 59 | Admitting: *Deleted

## 2011-08-24 ENCOUNTER — Ambulatory Visit (INDEPENDENT_AMBULATORY_CARE_PROVIDER_SITE_OTHER): Payer: 59 | Admitting: Physician Assistant

## 2011-08-24 ENCOUNTER — Encounter (INDEPENDENT_AMBULATORY_CARE_PROVIDER_SITE_OTHER): Payer: Self-pay

## 2011-08-24 VITALS — BP 156/104 | HR 76 | Temp 97.2°F | Resp 18 | Ht 71.75 in | Wt 361.6 lb

## 2011-08-24 DIAGNOSIS — Z4651 Encounter for fitting and adjustment of gastric lap band: Secondary | ICD-10-CM

## 2011-08-24 NOTE — Progress Notes (Signed)
  HISTORY: [name] is a 41 y.o.male who received an AP-Large lap-band in September 2012 by Dr. Johna Sheriff. He comes in having last been seen in November 2012 and has unfortunately gained 20 lbs.  He has increased hunger and portion sizes but no untoward symptoms. He'd like a fill today.  VITAL SIGNS: Filed Vitals:   08/24/11 0906  BP: 156/104  Pulse: 76  Temp: 97.2 F (36.2 C)  Resp: 18    PHYSICAL EXAM: Physical exam reveals a very well-appearing 40 y.o.male in no apparent distress Neurologic: Awake, alert, oriented Psych: Bright affect, conversant Respiratory: Breathing even and unlabored. No stridor or wheezing Abdomen: Soft, nontender, nondistended to palpation. Incisions well-healed. No incisional hernias. Port easily palpated. Extremities: Atraumatic, good range of motion.  ASSESMENT: 41 y.o.  male  s/p AP-Large lap-band.   PLAN: The patient's port was accessed with a 20G Huber needle without difficulty. Clear fluid was aspirated and 1.5 mL saline was added to the port to give a total predicted volume of 5 mL. The patient was able to swallow water without difficulty following the procedure and was instructed to take clear liquids for the next 24-48 hours and advance slowly as tolerated.

## 2011-08-24 NOTE — Patient Instructions (Signed)
Take clear liquids tonight. Thin protein shakes are ok to start tomorrow morning. Slowly advance your diet thereafter. Call us if you have persistent vomiting or regurgitation, night cough or reflux symptoms. Return as scheduled or sooner if you notice no changes in hunger/portion sizes.  

## 2011-10-05 ENCOUNTER — Encounter (INDEPENDENT_AMBULATORY_CARE_PROVIDER_SITE_OTHER): Payer: 59

## 2011-10-26 ENCOUNTER — Encounter (INDEPENDENT_AMBULATORY_CARE_PROVIDER_SITE_OTHER): Payer: Self-pay

## 2011-10-26 ENCOUNTER — Ambulatory Visit (INDEPENDENT_AMBULATORY_CARE_PROVIDER_SITE_OTHER): Payer: 59 | Admitting: Physician Assistant

## 2011-10-26 VITALS — BP 144/90 | HR 84 | Temp 97.8°F | Resp 24 | Ht 71.75 in | Wt 371.2 lb

## 2011-10-26 DIAGNOSIS — Z4651 Encounter for fitting and adjustment of gastric lap band: Secondary | ICD-10-CM

## 2011-10-26 NOTE — Progress Notes (Signed)
  HISTORY: [name] is a 41 y.o.male who received an AP-Large lap-band in September 2012 by Dr. Johna Sheriff. He comes in with persistent weight gain, hunger and large portion sizes. He can eat an entire plate of food for dinner without difficulty. He's drinking plenty of water and engaged in regular exercise but is frustrated with the degree of weight loss.  VITAL SIGNS: Filed Vitals:   10/26/11 1228  BP: 144/90  Pulse: 84  Temp: 97.8 F (36.6 C)  Resp: 24    PHYSICAL EXAM: Physical exam reveals a very well-appearing 41 y.o.male in no apparent distress Neurologic: Awake, alert, oriented Psych: Bright affect, conversant Respiratory: Breathing even and unlabored. No stridor or wheezing Abdomen: Soft, nontender, nondistended to palpation. Incisions well-healed. No incisional hernias. Port easily palpated. Extremities: Atraumatic, good range of motion.  ASSESMENT: 41 y.o.  male  s/p AP-Large lap-band.   PLAN: The patient's port was accessed with a 20G Huber needle without difficulty. Clear fluid was aspirated and 2 mL saline was added to the port to give a total predicted volume of 7 mL. The patient was able to swallow water without difficulty following the procedure and was instructed to take clear liquids for the next 24-48 hours and advance slowly as tolerated. I emphasized the need to return in a timely basis to keep track of weight loss success and to make any modifications at that time. He voiced understanding and agreement.

## 2011-10-26 NOTE — Patient Instructions (Signed)
Take clear liquids tonight. Thin protein shakes are ok to start tomorrow morning. Slowly advance your diet thereafter. Call us if you have persistent vomiting or regurgitation, night cough or reflux symptoms. Return as scheduled or sooner if you notice no changes in hunger/portion sizes.  

## 2011-11-23 ENCOUNTER — Encounter (INDEPENDENT_AMBULATORY_CARE_PROVIDER_SITE_OTHER): Payer: Self-pay

## 2011-11-23 ENCOUNTER — Ambulatory Visit (INDEPENDENT_AMBULATORY_CARE_PROVIDER_SITE_OTHER): Payer: 59 | Admitting: Physician Assistant

## 2011-11-23 VITALS — BP 148/90 | HR 72 | Temp 97.6°F | Resp 18 | Ht 71.75 in | Wt 359.4 lb

## 2011-11-23 DIAGNOSIS — Z4651 Encounter for fitting and adjustment of gastric lap band: Secondary | ICD-10-CM

## 2011-11-23 NOTE — Progress Notes (Signed)
  HISTORY: [name] is a 41 y.o.male who received an AP-Large lap-band in September 2012 by Dr. Johna Sheriff. He comes in having lost 11 lbs since his last visit. He says his hunger has improve as have his portion sizes but he's still eating more at one sitting than desired. He denies persistent regurgitation or reflux.  VITAL SIGNS: Filed Vitals:   11/23/11 1027  BP: 148/90  Pulse: 72  Temp: 97.6 F (36.4 C)  Resp: 18    PHYSICAL EXAM: Physical exam reveals a very well-appearing 41 y.o.male in no apparent distress Neurologic: Awake, alert, oriented Psych: Bright affect, conversant Respiratory: Breathing even and unlabored. No stridor or wheezing Abdomen: Soft, nontender, nondistended to palpation. Incisions well-healed. No incisional hernias. Port easily palpated. Extremities: Atraumatic, good range of motion.  ASSESMENT: 41 y.o.  male  s/p AP-Large lap-band.   PLAN: The patient's port was accessed with a 20G Huber needle without difficulty. Clear fluid was aspirated and 0.5 mL saline was added to the port to give a total predicted volume of 7.5 mL. The patient was able to swallow water without difficulty following the procedure and was instructed to take clear liquids for the next 24-48 hours and advance slowly as tolerated.

## 2011-11-23 NOTE — Patient Instructions (Signed)
Take clear liquids tonight. Thin protein shakes are ok to start tomorrow morning. Slowly advance your diet thereafter. Call us if you have persistent vomiting or regurgitation, night cough or reflux symptoms. Return as scheduled or sooner if you notice no changes in hunger/portion sizes.  

## 2011-12-21 ENCOUNTER — Encounter (INDEPENDENT_AMBULATORY_CARE_PROVIDER_SITE_OTHER): Payer: 59

## 2011-12-21 ENCOUNTER — Ambulatory Visit (INDEPENDENT_AMBULATORY_CARE_PROVIDER_SITE_OTHER): Payer: 59 | Admitting: Surgery

## 2011-12-21 ENCOUNTER — Encounter (INDEPENDENT_AMBULATORY_CARE_PROVIDER_SITE_OTHER): Payer: Self-pay

## 2011-12-21 VITALS — BP 166/92 | HR 76 | Temp 97.0°F | Resp 16 | Ht 72.0 in | Wt 363.6 lb

## 2011-12-21 DIAGNOSIS — Z4651 Encounter for fitting and adjustment of gastric lap band: Secondary | ICD-10-CM

## 2011-12-21 NOTE — Patient Instructions (Signed)
Take clear liquids tonight. Thin protein shakes are ok to start tomorrow morning. Slowly advance your diet thereafter. Call us if you have persistent vomiting or regurgitation, night cough or reflux symptoms. Return as scheduled or sooner if you notice no changes in hunger/portion sizes.  

## 2011-12-21 NOTE — Progress Notes (Signed)
  HISTORY: [name] is a 41 y.o.male who received an AP-Large lap-band in September 2012. He comes in with a four pound weight increase over the past month. He's had reduced exercise due to working average 60 hour weeks. That's scheduled to cease this week and he plans on getting back into the exercise habit. He denies persistent regurgitation although he notes that he's not tolerant of bread any longer. He noticed an improvement in hunger but still has larger than desired portion sizes.  VITAL SIGNS: Filed Vitals:   12/21/11 0928  BP: 166/92  Pulse: 76  Temp: 97 F (36.1 C)  Resp: 16    PHYSICAL EXAM: Physical exam reveals a very well-appearing 41 y.o.male in no apparent distress Neurologic: Awake, alert, oriented Psych: Bright affect, conversant Respiratory: Breathing even and unlabored. No stridor or wheezing Abdomen: Soft, nontender, nondistended to palpation. Incisions well-healed. No incisional hernias. Port easily palpated. Extremities: Atraumatic, good range of motion.  ASSESMENT: 41 y.o.  male  s/p AP-Large lap-band.   PLAN: The patient's port was accessed with a 20G Huber needle without difficulty. Clear fluid was aspirated and 0.5 mL saline was added to the port to give a total predicted volume of 8 mL. The patient was able to swallow water without difficulty following the procedure and was instructed to take clear liquids for the next 24-48 hours and advance slowly as tolerated.

## 2012-01-25 ENCOUNTER — Encounter (INDEPENDENT_AMBULATORY_CARE_PROVIDER_SITE_OTHER): Payer: 59

## 2012-02-06 ENCOUNTER — Encounter (INDEPENDENT_AMBULATORY_CARE_PROVIDER_SITE_OTHER): Payer: Self-pay | Admitting: Physician Assistant

## 2012-05-04 ENCOUNTER — Ambulatory Visit: Payer: Self-pay

## 2012-05-04 ENCOUNTER — Ambulatory Visit: Payer: Self-pay | Admitting: Family Medicine

## 2012-05-04 VITALS — BP 135/85 | HR 84 | Temp 98.1°F | Resp 18 | Ht 70.38 in | Wt 357.2 lb

## 2012-05-04 DIAGNOSIS — M898X5 Other specified disorders of bone, thigh: Secondary | ICD-10-CM

## 2012-05-04 DIAGNOSIS — T148XXA Other injury of unspecified body region, initial encounter: Secondary | ICD-10-CM

## 2012-05-04 DIAGNOSIS — M545 Low back pain, unspecified: Secondary | ICD-10-CM

## 2012-05-04 DIAGNOSIS — M25519 Pain in unspecified shoulder: Secondary | ICD-10-CM

## 2012-05-04 DIAGNOSIS — N289 Disorder of kidney and ureter, unspecified: Secondary | ICD-10-CM

## 2012-05-04 DIAGNOSIS — Z0289 Encounter for other administrative examinations: Secondary | ICD-10-CM

## 2012-05-04 DIAGNOSIS — I10 Essential (primary) hypertension: Secondary | ICD-10-CM

## 2012-05-04 DIAGNOSIS — M25559 Pain in unspecified hip: Secondary | ICD-10-CM

## 2012-05-04 LAB — POCT CBC
Granulocyte percent: 75.3 %G (ref 37–80)
HCT, POC: 39.3 % — AB (ref 43.5–53.7)
Hemoglobin: 11.6 g/dL — AB (ref 14.1–18.1)
Lymph, poc: 2 (ref 0.6–3.4)
MCH, POC: 23.9 pg — AB (ref 27–31.2)
MCHC: 29.5 g/dL — AB (ref 31.8–35.4)
MCV: 80.9 fL (ref 80–97)
MID (cbc): 0.7 (ref 0–0.9)
MPV: 8.8 fL (ref 0–99.8)
POC Granulocyte: 8.2 — AB (ref 2–6.9)
POC LYMPH PERCENT: 18.5 %L (ref 10–50)
POC MID %: 6.2 % (ref 0–12)
Platelet Count, POC: 353 10*3/uL (ref 142–424)
RBC: 4.86 M/uL (ref 4.69–6.13)
RDW, POC: 16.7 %
WBC: 10.9 10*3/uL — AB (ref 4.6–10.2)

## 2012-05-04 LAB — COMPREHENSIVE METABOLIC PANEL WITH GFR
ALT: 15 U/L (ref 0–53)
AST: 16 U/L (ref 0–37)
Chloride: 103 meq/L (ref 96–112)
Creat: 1.15 mg/dL (ref 0.50–1.35)
Sodium: 139 meq/L (ref 135–145)
Total Bilirubin: 0.5 mg/dL (ref 0.3–1.2)
Total Protein: 7.1 g/dL (ref 6.0–8.3)

## 2012-05-04 LAB — COMPREHENSIVE METABOLIC PANEL
Albumin: 4.3 g/dL (ref 3.5–5.2)
Alkaline Phosphatase: 65 U/L (ref 39–117)
BUN: 12 mg/dL (ref 6–23)
CO2: 26 mEq/L (ref 19–32)
Calcium: 9.2 mg/dL (ref 8.4–10.5)
Glucose, Bld: 89 mg/dL (ref 70–99)
Potassium: 4.1 mEq/L (ref 3.5–5.3)

## 2012-05-04 LAB — MICROALBUMIN / CREATININE URINE RATIO
Creatinine, Urine: 247.6 mg/dL
Microalb Creat Ratio: 64.3 mg/g — ABNORMAL HIGH (ref 0.0–30.0)
Microalb, Ur: 15.92 mg/dL — ABNORMAL HIGH (ref 0.00–1.89)

## 2012-05-04 MED ORDER — TRAMADOL HCL 50 MG PO TABS: 50.0000 mg | ORAL_TABLET | Freq: Three times a day (TID) | ORAL | Status: DC | PRN

## 2012-05-04 MED ORDER — CYCLOBENZAPRINE HCL 10 MG PO TABS: ORAL_TABLET | ORAL | Status: DC

## 2012-05-04 MED ORDER — AMLODIPINE BESYLATE 10 MG PO TABS: 10.0000 mg | ORAL_TABLET | Freq: Every day | ORAL | Status: DC
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
## 2012-05-09 ENCOUNTER — Encounter: Payer: Self-pay | Admitting: Family Medicine

## 2012-06-24 ENCOUNTER — Telehealth: Payer: Self-pay

## 2012-06-24 DIAGNOSIS — G4730 Sleep apnea, unspecified: Secondary | ICD-10-CM

## 2012-06-24 NOTE — Telephone Encounter (Signed)
Pt is wanting to talk with dr Conley Rolls about a sleep study  Best number [phone]

## 2012-06-24 NOTE — Telephone Encounter (Signed)
Will give DOT for 3 months , need to be compliant with meds and CPAP  Dr. Gaylan Gerold ( his PCP ) can get his CPAP machine paperwork straighten out since they were the ones who gave him that sleep study referral. When he returns in 3 mos he was instructed to bring his CPAP documentation whether it is a printoutfrom the machine or a note from his physician ( neurology/pulmonology) about being complaint with his CPAP use. He is also to be complaint with his Norvasc Patient is aware of both these issues.  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

## 2012-06-25 NOTE — Telephone Encounter (Signed)
Patient not using CPAP he is asking for a referral for this, can we refer?

## 2012-06-25 NOTE — Telephone Encounter (Signed)
Sent order. To referrals per Dr Conley Rolls, to be done with Dr Richardean Chimera, called patient to advise left detailed message.

## 2012-07-13 DIAGNOSIS — Z0271 Encounter for disability determination: Secondary | ICD-10-CM

## 2012-08-03 ENCOUNTER — Ambulatory Visit (INDEPENDENT_AMBULATORY_CARE_PROVIDER_SITE_OTHER): Payer: 59 | Admitting: Family Medicine

## 2012-08-03 VITALS — BP 132/86 | HR 90 | Temp 98.0°F | Resp 17 | Ht 71.5 in | Wt 350.0 lb

## 2012-08-03 DIAGNOSIS — Z125 Encounter for screening for malignant neoplasm of prostate: Secondary | ICD-10-CM

## 2012-08-03 DIAGNOSIS — Z0000 Encounter for general adult medical examination without abnormal findings: Secondary | ICD-10-CM

## 2012-08-03 DIAGNOSIS — G4733 Obstructive sleep apnea (adult) (pediatric): Secondary | ICD-10-CM

## 2012-08-03 DIAGNOSIS — I10 Essential (primary) hypertension: Secondary | ICD-10-CM

## 2012-08-03 LAB — POCT CBC
Granulocyte percent: 77.6 %G (ref 37–80)
HCT, POC: 45.5 % (ref 43.5–53.7)
Hemoglobin: 13.9 g/dL — AB (ref 14.1–18.1)
MCV: 80 fL (ref 80–97)
POC Granulocyte: 9.8 — AB (ref 2–6.9)
POC LYMPH PERCENT: 16.9 %L (ref 10–50)
RBC: 5.69 M/uL (ref 4.69–6.13)

## 2012-08-03 MED ORDER — LISINOPRIL 20 MG PO TABS: 20.0000 mg | ORAL_TABLET | Freq: Every day | ORAL | Status: DC

## 2012-08-03 NOTE — Progress Notes (Signed)
Commercial Driver Medical Examination   [name] is a 42 y.o. male who presents today for a commercial driver fitness determination physical exam. The patient reports no problems. The following portions of the patient's history were reviewed and updated as appropriate: allergies, current medications, past family history, past medical history, past social history, past surgical history and problem list. Review of Systems Pertinent items are noted in HPI.   Objective:    Vision:  Uncorrected Corrected Horizontal Field of Vision  Right Eye 20/20 20/20 85 degrees  Left Eye  20/20 20/20 85 degrees  Both Eyes  20/20 20/20    Applicant can recognize and distinguish among traffic control signals and devices showing standard red, green, and amber colors. Can only meet criteria wearing corrective lenses.    Monocular Vision?: No   Hearing:   500 Hz 1000 Hz 2000 Hz 4000 Hz  Right Ear  NA NA NA NA  Left Ear  NA NA NA NA  Whisper test passed at 10 feet bilaterally.    BP 132/86  Pulse 90  Temp(Src) 98 F (36.7 C) (Oral)  Resp 17  Ht 5' 11.5" (1.816 m)  Wt 350 lb (158.759 kg)  BMI 48.14 kg/m2  SpO2 92%  General Appearance:    Alert, cooperative, no distress, appears stated age  Head:    Normocephalic, without obvious abnormality, atraumatic  Eyes:    PERRL, conjunctiva/corneas clear, EOM's intact, fundi    benign, both eyes       Ears:    Normal TM's and external ear canals, both ears  Nose:   Nares normal, septum midline, mucosa normal, no drainage    or sinus tenderness  Throat:   Lips, mucosa, and tongue normal; teeth and gums normal  Neck:   Supple, symmetrical, trachea midline, no adenopathy;       thyroid:  No enlargement/tenderness/nodules; no carotid   bruit or JVD  Back:     Symmetric, no curvature, ROM normal, no CVA tenderness  Lungs:     Clear to auscultation bilaterally, respirations unlabored  Chest wall:    No tenderness or deformity  Heart:    Regular rate  and rhythm, S1 and S2 normal, no murmur, rub   or gallop  Abdomen:     Soft, non-tender, bowel sounds active all four quadrants,    no masses, no organomegaly  Genitalia:    Normal male without lesion, discharge or tenderness     Extremities:   Extremities normal, atraumatic, no cyanosis or edema  Pulses:   2+ and symmetric all extremities  Skin:   Skin color, texture, turgor normal, no rashes or lesions  Lymph nodes:   Cervical, supraclavicular, and axillary nodes normal  Neurologic:   CNII-XII intact. Normal strength, sensation and reflexes      throughout    Labs: Lab Results  Component Value Date   PROTEINUR 100* 02/26/2011   BILIRUBINUR NEGATIVE 02/26/2011   GLUCOSEU NEGATIVE 02/26/2011      Assessment:    Healthy male exam.  Meets standards, but periodic monitoring required due to HTN, OSA, stage 1 CKD.  Driver qualified only for 6 months.    Plan:    Medical examiners certificate completed and printed. Need follow-up in 6 months for recheck of OSA - needs to bring documentation of CPAP use. Return as needed.

## 2012-08-03 NOTE — Patient Instructions (Signed)
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
## 2012-08-03 NOTE — Progress Notes (Signed)
UA Dip: Specific Gravity: 1.015 Protein: 100 Blood: Trace Sugar: Neg

## 2012-08-04 LAB — LIPID PANEL
Cholesterol: 174 mg/dL (ref 0–200)
LDL Cholesterol: 120 mg/dL — ABNORMAL HIGH (ref 0–99)
Total CHOL/HDL Ratio: 4.5 Ratio
VLDL: 15 mg/dL (ref 0–40)

## 2012-08-04 LAB — TSH: TSH: 1.552 u[IU]/mL (ref 0.350–4.500)

## 2012-08-04 LAB — BASIC METABOLIC PANEL
BUN: 10 mg/dL (ref 6–23)
Creat: 1.1 mg/dL (ref 0.50–1.35)
Potassium: 4.4 mEq/L (ref 3.5–5.3)

## 2012-08-04 LAB — PSA: PSA: 1.81 ng/mL (ref <=4.00)

## 2012-08-04 LAB — FERRITIN: Ferritin: 101 ng/mL (ref 22–322)

## 2012-08-05 ENCOUNTER — Telehealth: Payer: Self-pay

## 2012-08-05 NOTE — Telephone Encounter (Signed)
PT WAS IN YESTERDAY FOR DOT PE, HE WOULD NOW LIKE A COPY OF HIS LONG FORM, I HAVE LOOKED AS FAR AS I COULD IN THE SCAN PILE AND I CANNOT FIND IT. BEST# [id number]

## 2012-08-06 ENCOUNTER — Encounter: Payer: Self-pay | Admitting: *Deleted

## 2012-08-06 NOTE — Telephone Encounter (Signed)
Left message for patient to pick up long form.

## 2012-08-19 ENCOUNTER — Encounter: Payer: Self-pay | Admitting: Family Medicine

## 2012-08-30 ENCOUNTER — Encounter: Payer: Self-pay | Admitting: Neurology

## 2012-09-09 ENCOUNTER — Other Ambulatory Visit: Payer: Self-pay | Admitting: Family Medicine

## 2012-10-26 ENCOUNTER — Encounter: Payer: Self-pay | Admitting: Family Medicine

## 2012-11-24 ENCOUNTER — Other Ambulatory Visit: Payer: Self-pay | Admitting: Physician Assistant

## 2012-11-30 ENCOUNTER — Encounter: Payer: Self-pay | Admitting: Neurology

## 2012-12-06 ENCOUNTER — Encounter (INDEPENDENT_AMBULATORY_CARE_PROVIDER_SITE_OTHER): Payer: 59 | Admitting: Surgery

## 2012-12-20 ENCOUNTER — Encounter (INDEPENDENT_AMBULATORY_CARE_PROVIDER_SITE_OTHER): Payer: Self-pay | Admitting: Surgery

## 2013-01-17 ENCOUNTER — Other Ambulatory Visit: Payer: Self-pay | Admitting: Family Medicine

## 2013-05-10 ENCOUNTER — Other Ambulatory Visit: Payer: Self-pay | Admitting: Family Medicine

## 2013-08-27 ENCOUNTER — Encounter: Payer: Self-pay | Admitting: Family Medicine

## 2013-10-27 ENCOUNTER — Telehealth: Payer: Self-pay | Admitting: Neurology

## 2013-10-27 DIAGNOSIS — Z9989 Dependence on other enabling machines and devices: Principal | ICD-10-CM

## 2013-10-27 DIAGNOSIS — G4733 Obstructive sleep apnea (adult) (pediatric): Secondary | ICD-10-CM

## 2013-10-29 ENCOUNTER — Ambulatory Visit (INDEPENDENT_AMBULATORY_CARE_PROVIDER_SITE_OTHER): Payer: Managed Care, Other (non HMO) | Admitting: Neurology

## 2013-10-29 ENCOUNTER — Encounter (INDEPENDENT_AMBULATORY_CARE_PROVIDER_SITE_OTHER): Payer: Self-pay

## 2013-10-29 ENCOUNTER — Encounter: Payer: Self-pay | Admitting: Neurology

## 2013-10-29 VITALS — BP 151/99 | HR 83 | Resp 18 | Ht 72.0 in | Wt 343.0 lb

## 2013-10-29 DIAGNOSIS — I10 Essential (primary) hypertension: Secondary | ICD-10-CM | POA: Insufficient documentation

## 2013-10-29 DIAGNOSIS — G4733 Obstructive sleep apnea (adult) (pediatric): Secondary | ICD-10-CM

## 2013-10-29 DIAGNOSIS — Z9989 Dependence on other enabling machines and devices: Principal | ICD-10-CM

## 2013-10-29 NOTE — Patient Instructions (Signed)
Obesity Obesity is defined as having too much total body fat and a body mass index (BMI) of 30 or more. BMI is an estimate of body fat and is calculated from your height and weight. Obesity happens when you consume more calories than you can burn by exercising or performing daily physical tasks. Prolonged obesity can cause major illnesses or emergencies, such as:   A stroke.  Heart disease.  Diabetes.  Cancer.  Arthritis.  High blood pressure (hypertension).  High cholesterol.  Sleep apnea.  Erectile dysfunction.  Infertility problems. CAUSES   Regularly eating unhealthy foods.  Physical inactivity.  Certain disorders, such as an underactive thyroid (hypothyroidism), Cushing's syndrome, and polycystic ovarian syndrome.  Certain medicines, such as steroids, some depression medicines, and antipsychotics.  Genetics.  Lack of sleep. DIAGNOSIS  A caregiver can diagnose obesity after calculating your BMI. Obesity will be diagnosed if your BMI is 30 or higher.  There are other methods of measuring obesity levels. Some other methods include measuring your skin fold thickness, your waist circumference, and comparing your hip circumference to your waist circumference. TREATMENT  A healthy treatment program includes some or all of the following:  Long-term dietary changes.  Exercise and physical activity.  Behavioral and lifestyle changes.  Medicine only under the supervision of your caregiver. Medicines may help, but only if they are used with diet and exercise programs. An unhealthy treatment program includes:  Fasting.  Fad diets.  Supplements and drugs. These choices do not succeed in long-term weight control.  HOME CARE INSTRUCTIONS   Exercise and perform physical activity as directed by your caregiver. To increase physical activity, try the following:  Use stairs instead of elevators.  Park farther away from store entrances.  Garden, bike, or walk instead of  watching television or using the computer.  Eat healthy, low-calorie foods and drinks on a regular basis. Eat more fruits and vegetables. Use low-calorie cookbooks or take healthy cooking classes.  Limit fast food, sweets, and processed snack foods.  Eat smaller portions.  Keep a daily journal of everything you eat. There are many free websites to help you with this. It may be helpful to measure your foods so you can determine if you are eating the correct portion sizes.  Avoid drinking alcohol. Drink more water and drinks without calories.  Take vitamins and supplements only as recommended by your caregiver.  Weight-loss support groups, Registered Dieticians, counselors, and stress reduction education can also be very helpful. SEEK IMMEDIATE MEDICAL CARE IF:  You have chest pain or tightness.  You have trouble breathing or feel short of breath.  You have weakness or leg numbness.  You feel confused or have trouble talking.  You have sudden changes in your vision. MAKE SURE YOU:  Understand these instructions.  Will watch your condition.  Will get help right away if you are not doing well or get worse. Document Released: 07/06/2004 Document Revised: 11/28/2011 Document Reviewed: 07/05/2011 [employer] Patient Information 2014 [employer].  

## 2013-10-29 NOTE — Progress Notes (Signed)
[facility] SLEEP MEDICINE CONSULT   Provider:  Melvyn Novasarmen  Amarionna Arca, M D  Referring Provider: Sherren MochaShaw, Eva N, MD Primary Care Physician:  Norberto SorensonSHAW,EVA, MD  Chief Complaint  Patient presents with  . Follow-up    Room 11  . Sleep consult    HPI:  [name] is a 43 y.o. male  Is seen here as a referral   from Dr. Clelia CroftShaw for a DOT validation CPAP compliance visit.  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 Rockne Coonshao Phuong Le. Dr. Nedra HaiLee referred this gentleman in February last year for a direct sleep study. The patient at the time that a history of hypertension, renal insufficiency morbid obesity and lower extremity edema. [facility] with severe apnea the AHI was 81.8 per hour the oxygen nadir of 73% CO2 was not measured at the time. He was titrated to CPAP but could not tolerate the setting of palm and thereby his obstructive sleep apnea remains untreated.  He developed progressive problems with snoring, witnessed apneas and difficulties maintaining sleep as well as staying awake- he also has orthopnea.  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 The revealed AHI was 123.5/hr. in his sleep study from 07-19-12 the patient was not titrated to 14 cm water, which reveals the AHI significantly but he had trouble tolerating musket machine even doing is CPAP stay. We recommended for this patient's DME to start the CPAP with a ramp function for a duration of about 8 minutes (gentle)   6 cm to a 14 cm goal  pressure , under the use of a small seize ResMed nasal mask.   He remains super -obese. He endorsed variable sleep times , due to variable driving assignments. He has  Used his machine he stated, but the chip card is not in the CPAP, he left it at his employer.  compliance can not be confirmed here and now, as is the purpose of this visit. On most days the patient works between 12 midnight and 12 noon,   Monday through Friday -when his shift is all for he returns to the back of the truck to sleep. He often cannot return for the night Karie GeorgesHolm as he drives across country. He gets between 4 and 8 hours of sleep in the truck. Her CPAP machine has a plug in to his cigarette lighter.  On weekends he and his family sleep a lot , Saturday is his holiday , he is a 7[employer] day [employer]ist. He keeps sabbath after Friday sunset.   He drinks 2 caffeine containing  beverages in spite of his church's teachings. That helps him to stay focuses on the road.  He doesn't drink alcohol and doesn't smoke.   He is endorsing the fatigue severity scale at 22 points , Epworth sleepiness score at 7 points, depression at 7 points. He got separated form his wife of 10 years and divorce is pending. This has caused him some stress , relapses into stress eating . The couple has one 42 year old daughter.         Review of Systems: Out of a complete 14 system review, the patient complains of only the following symptoms, and all other reviewed systems are negative. He is endorsing the fatigue severity scale at 22 points , Epworth sleepiness score at 7 points, depression at 7 points.  EDS when driving. Weight loss after lab  band surgery 2012.  He has lost 80 pounds,   History   Social History  . Marital Status: Married    Spouse Name: [name]    Number of Children: 1  . Years of Education: Master's   Occupational History  .  [employer]   Social History Main Topics  . Smoking status: Never Smoker   . Smokeless tobacco: Never Used  . Alcohol Use: No  . Drug Use: No  . Sexual Activity: Yes    Birth Control/ Protection: None   Other Topics Concern  . Not on file   Social History Narrative   Patient is married Automotive engineer(Deborah) and lives at home with his wife and daughter.   Patient is working full-time.   Patient has a Master's degree.   Patient is right-handed.   Patient drinks two cups of coffee daily.    Family  History  Problem Relation Age of Onset  . Cancer Paternal Grandmother     unaware  . Hyperlipidemia Mother   . Diabetes Father     Past Medical History  Diagnosis Date  . Hypertension   . OSA (obstructive sleep apnea)   . OSA on CPAP     DOT driver   . Obesity (BMI 35.0-39.9 without comorbidity)     Past Surgical History  Procedure Laterality Date  . Colonoscopy    . Nasal septum surgery    . Laparoscopic gastric banding  03/07/11    Current Outpatient Prescriptions  Medication Sig Dispense Refill  . amLODipine (NORVASC) 10 MG tablet TAKE 1 TABLET (10 MG TOTAL) BY MOUTH DAILY.  30 tablet  2  . Multiple Vitamin (MULTIVITAMIN) capsule Take 1 capsule by mouth daily.        Marland Kitchen. amLODipine (NORVASC) 10 MG tablet TAKE 1 TABLET (10 MG TOTAL) BY MOUTH DAILY.  30 tablet  0   No current facility-administered medications for this visit.    Allergies as of 10/29/2013  . (No Known Allergies)    Vitals: BP 151/99  Pulse 83  Resp 18  Ht 6' (1.829 m)  Wt 343 lb (155.584 kg)  BMI 46.51 kg/m2 Last Weight:  Wt Readings from Last 1 Encounters:  10/29/13 343 lb (155.584 kg)   Last Height:   Ht Readings from Last 1 Encounters:  10/29/13 6' (1.829 m)    Physical exam:  General: The patient is awake, alert and appears not in acute distress. The patient is well groomed. Head: Normocephalic, atraumatic. Neck is supple. Mallampati 4 , neck circumference: 22 inches, retrognathia. Mild degree.  Cardiovascular:  Regular rate and rhythm , without  murmurs or carotid bruit, and without distended neck veins. Respiratory: Lungs are clear to auscultation. Skin:  Without evidence of edema, or rash Trunk: BMI is severely elevated and patient  has normal posture.  Neurologic exam : The patient is awake and alert, oriented to place and time.  Memory subjective described as intact.  There is a normal attention span & concentration ability. Speech is fluent without dysarthria, dysphonia or  aphasia. Mood and affect are appropriate.  Cranial nerves: Pupils are equal and briskly reactive to light. Funduscopic exam without evidence of pallor or edema. Extraocular movements  in vertical and horizontal planes intact and without nystagmus. Visual fields by finger perimetry are intact. Hearing to finger rub intact.  Facial sensation intact to fine touch. Facial motor strength is symmetric and tongue and uvula move midline.  Motor exam:  Normal tone and normal muscle bulk and symmetric  normal strength in all extremities.  Sensory:  Fine touch, pinprick and vibration were tested in all extremities. Proprioception normal.  Coordination: Rapid alternating movements in the fingers/hands is tested and normal. Finger-to-nose maneuver tested and normal without evidence of ataxia, dysmetria or tremor.  Gait and station: Patient walks without assistive device . Deep tendon reflexes: in the  upper and lower extremities are symmetric and intact. Babinski maneuver response is down going.   Assessment:  After physical and neurologic examination, review of laboratory studies, imaging, neurophysiology testing and pre-existing records, assessment is  1) patient with obesity related severe apnea and likely obesity hypoventilation. He stated he is compliant with CPAP use.   2) status post lab band   Plan:  Treatment plan and additional workup :  Continue CPAP. Awaiting download, prefer a modem for this patient , who needs yearly DOT data confirmation. He feels not excessively sleepy, feels fully awake. Declined nuvigil sample. .  [employer] is DME. [employer] is his employer.

## 2014-03-04 NOTE — Telephone Encounter (Signed)
See sleep study note. CD

## 2014-08-27 ENCOUNTER — Telehealth: Payer: Self-pay | Admitting: Neurology

## 2014-08-27 NOTE — Telephone Encounter (Signed)
Patient stated he need a letter faxed to Employer @ fax [phone], attention: Raiford Nobleick or Frederica KusterSavanah, stated patient's under Dr. Oliva Bustardohmeier's care.  Please call and advise.

## 2014-08-28 ENCOUNTER — Encounter: Payer: Self-pay | Admitting: *Deleted

## 2014-08-28 NOTE — Telephone Encounter (Signed)
Letter done.  Faxed to [place] or [place]'s attention as per pts request.  [phone].  I notified pt that did fax via VM.

## 2014-11-04 ENCOUNTER — Ambulatory Visit: Payer: Managed Care, Other (non HMO) | Admitting: Neurology

## 2015-09-09 ENCOUNTER — Telehealth: Payer: Self-pay | Admitting: Neurology

## 2015-09-09 NOTE — Telephone Encounter (Signed)
Patient is calling. He needs a letter stating he is a patient of Dr. Oliva Bustardohmeier's. Please fax to [employer] at [phone].

## 2015-09-09 NOTE — Telephone Encounter (Signed)
I spoke to pt. I advised him that we haven't seen him in almost 2 years. Before Dr. Vickey Hugerohmeier will write this letter, pt will need to be seen for his yearly cpap exam. Pt states that he needs this as soon as possible. I made an appt with Aundra MilletMegan, NP for pt on 4/3 at 2:30. Pt verbalized understanding.

## 2015-09-13 ENCOUNTER — Ambulatory Visit: Payer: Self-pay | Admitting: Adult Health

## 2015-09-14 ENCOUNTER — Encounter: Payer: Self-pay | Admitting: Adult Health

## 2016-03-14 ENCOUNTER — Encounter (HOSPITAL_COMMUNITY): Payer: Self-pay

## 2016-04-07 ENCOUNTER — Telehealth (HOSPITAL_COMMUNITY): Payer: Self-pay

## 2016-04-07 NOTE — Telephone Encounter (Signed)
This patient is overdue for recommended follow-up with a b[facility]ric [facility]. A letter was mailed to the address on file on 10.3.17 from both [place] & [facility] in attempt to reestablish post-op care. Letter has been returned to [employer] marked undeliverable, unable to forward. No additional address [facility] or [employer]. Information was shared with Dario GuardianFrances Jackson today at [facility] so she may contact the patient via phone in attempt to get her scheduled for an appointment in their office.

## 2016-10-12 ENCOUNTER — Encounter (HOSPITAL_COMMUNITY): Payer: Self-pay

## 2021-07-22 ENCOUNTER — Other Ambulatory Visit: Payer: Self-pay

## 2021-07-22 ENCOUNTER — Ambulatory Visit
Admission: RE | Admit: 2021-07-22 | Discharge: 2021-07-22 | Disposition: A | Discharge status: Home / Self Care | Payer: Self-pay | Source: Ambulatory Visit | Attending: Family Medicine | Admitting: Family Medicine

## 2021-07-22 ENCOUNTER — Other Ambulatory Visit: Payer: Self-pay | Admitting: Family Medicine

## 2021-07-22 DIAGNOSIS — M5440 Lumbago with sciatica, unspecified side: Secondary | ICD-10-CM

## 2022-05-11 ENCOUNTER — Encounter: Payer: Self-pay | Admitting: Emergency Medicine

## 2022-05-11 ENCOUNTER — Ambulatory Visit
Admission: EM | Admit: 2022-05-11 | Discharge: 2022-05-11 | Disposition: A | Discharge status: Home / Self Care | Payer: BC Managed Care – PPO

## 2022-05-11 DIAGNOSIS — M545 Low back pain, unspecified: Secondary | ICD-10-CM | POA: Diagnosis not present

## 2022-05-11 DIAGNOSIS — M542 Cervicalgia: Secondary | ICD-10-CM | POA: Diagnosis not present

## 2022-05-11 DIAGNOSIS — M79661 Pain in right lower leg: Secondary | ICD-10-CM | POA: Diagnosis not present

## 2022-05-11 MED ORDER — METHOCARBAMOL 500 MG PO TABS
500.0000 mg | ORAL_TABLET | Freq: Two times a day (BID) | ORAL | 0 refills | Status: AC
Start: 2022-05-11 — End: ?
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
## 2022-05-11 NOTE — Discharge Instructions (Addendum)
Take medication as prescribed. Continue ibuprofen and Tylenol as directed. May use ice or heat as needed.  Apply ice for pain or swelling, heat for spasm or stiffness.  Apply for 20 minutes, remove for 1 hour, then repeat is much as possible. Recommend Epsom salt soaks while symptoms persist. Try to remain as active as possible to help decrease your recovery time. If symptoms do not improve, please follow-up with your primary care physician for further evaluation. Follow-up as needed.
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
## 2022-09-16 IMAGING — DX DG LUMBAR SPINE COMPLETE 4+V
5 series · 5 of 5 positions shown · non-contrast
Comparison: Lumbar spine radiograph dated 05/04/2012.

CLINICAL DATA: Back pain and sciatica.

EXAM:
LUMBAR SPINE - COMPLETE 4+ VIEW

[dg lumbar spine complete 4 +v (1 of 5)]
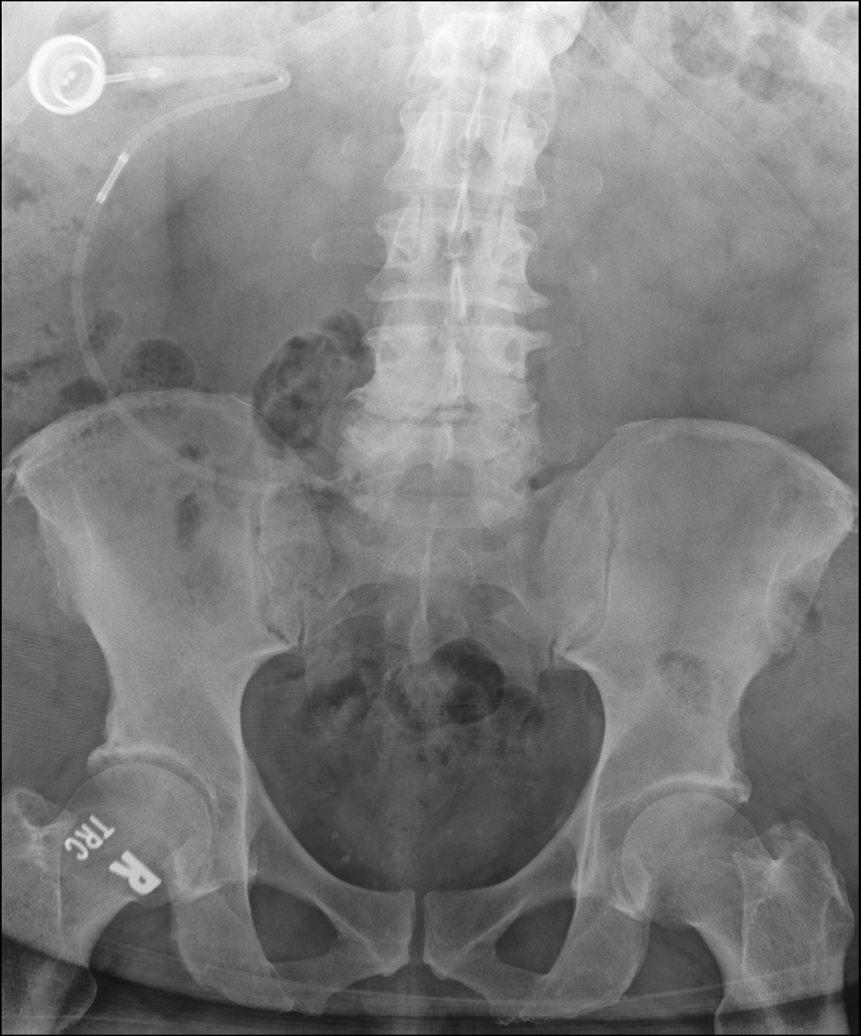

[dg lumbar spine complete 4 +v (2 of 5)]
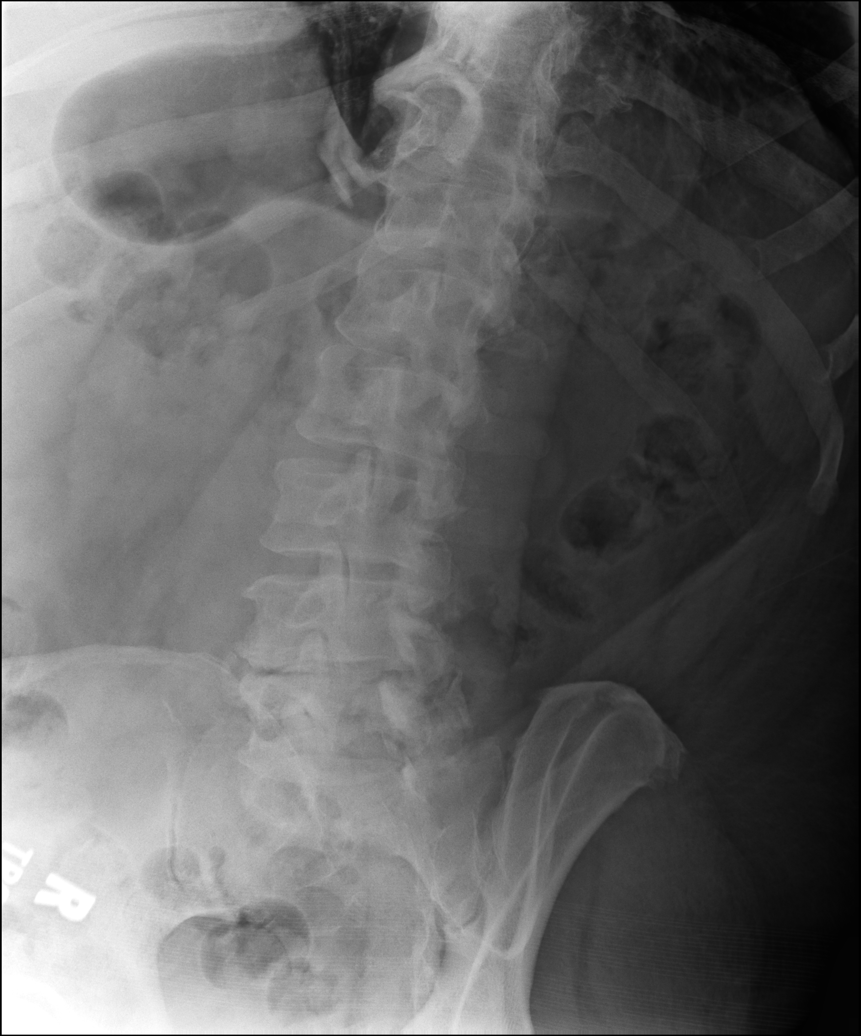

[dg lumbar spine complete 4 +v (3 of 5)]
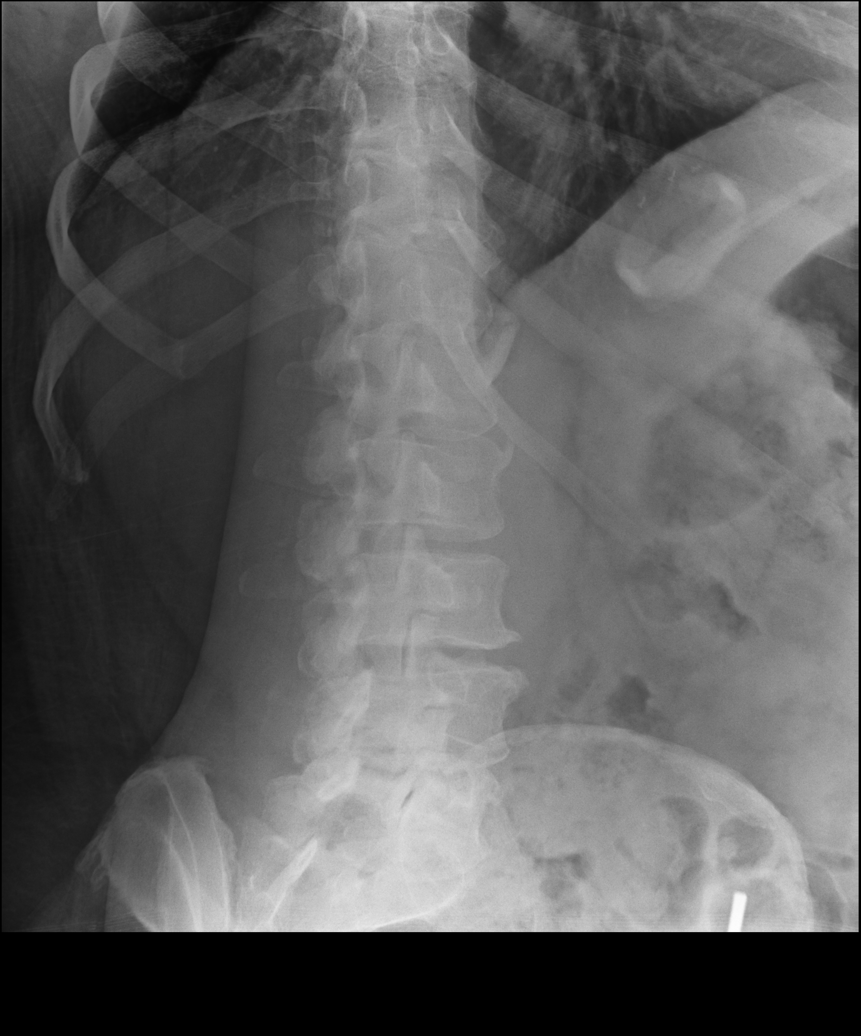

[dg lumbar spine complete 4 +v (4 of 5)]
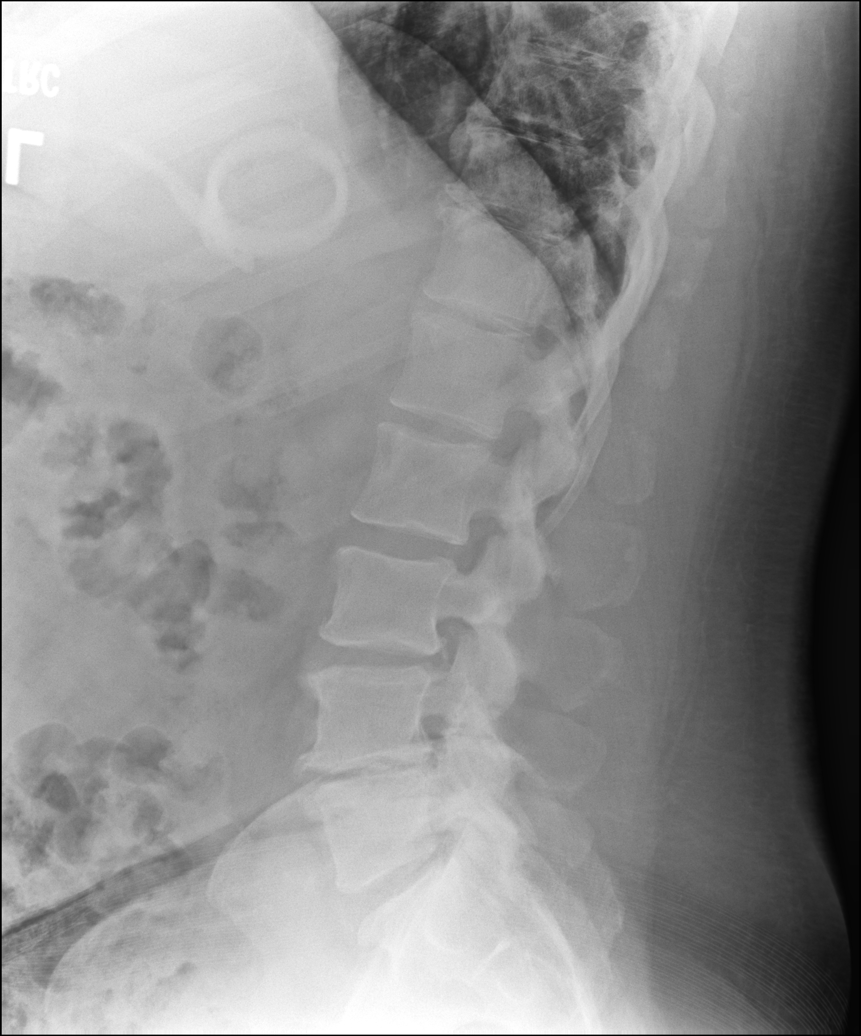

[dg lumbar spine complete 4 +v (5 of 5)]
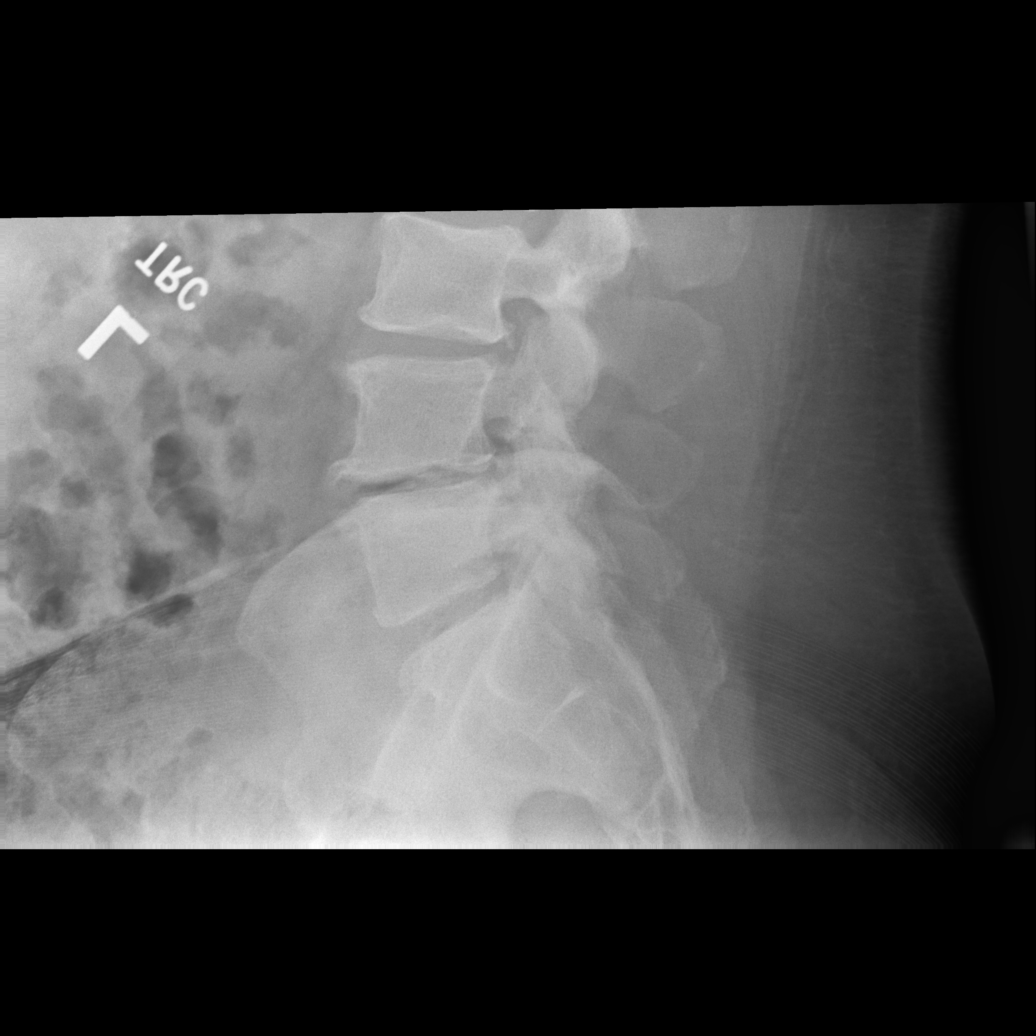

[5 of 5 positions shown; findings below may reference images not displayed]

FINDINGS: Five lumbar type vertebra. There is no acute fracture or subluxation
of the lumbar spine. Moderate to severe degenerative changes at
L4-L5 with disc space narrowing and endplate irregularity and
spurring. Degenerative changes at L3-L4 and L5-S1 to a lesser
degree. Lower lumbar facet arthropathy. The visualized posterior
elements are intact. The soft tissues are unremarkable. Gastric lap
band.
IMPRESSION: 1. No acute findings.
2. Degenerative changes of the lower lumbar spine, moderate to
severe at L4-L5.

## 2023-03-02 LAB — EXTERNAL GENERIC LAB PROCEDURE: COLOGUARD: NEGATIVE

## 2023-03-02 LAB — COLOGUARD: COLOGUARD: NEGATIVE

## 2023-06-04 ENCOUNTER — Other Ambulatory Visit: Payer: Self-pay

## 2023-06-04 ENCOUNTER — Emergency Department (HOSPITAL_COMMUNITY)
Admission: EM | Admit: 2023-06-04 | Discharge: 2023-06-04 | Disposition: A | Discharge status: Home / Self Care | Payer: BC Managed Care – PPO | Attending: Emergency Medicine | Admitting: Emergency Medicine

## 2023-06-04 ENCOUNTER — Emergency Department (HOSPITAL_COMMUNITY): Payer: BC Managed Care – PPO

## 2023-06-04 ENCOUNTER — Encounter (HOSPITAL_COMMUNITY): Payer: Self-pay | Admitting: *Deleted

## 2023-06-04 DIAGNOSIS — D72829 Elevated white blood cell count, unspecified: Secondary | ICD-10-CM | POA: Insufficient documentation

## 2023-06-04 DIAGNOSIS — M79662 Pain in left lower leg: Secondary | ICD-10-CM | POA: Diagnosis not present

## 2023-06-04 DIAGNOSIS — L03115 Cellulitis of right lower limb: Secondary | ICD-10-CM | POA: Insufficient documentation

## 2023-06-04 DIAGNOSIS — M79661 Pain in right lower leg: Secondary | ICD-10-CM | POA: Diagnosis present

## 2023-06-04 LAB — CBC WITH DIFFERENTIAL/PLATELET
Abs Immature Granulocytes: 0.07 10*3/uL (ref 0.00–0.07)
Basophils Absolute: 0 10*3/uL (ref 0.0–0.1)
Basophils Relative: 0 %
Eosinophils Absolute: 0.2 10*3/uL (ref 0.0–0.5)
Eosinophils Relative: 1 %
HCT: 43.7 % (ref 39.0–52.0)
Hemoglobin: 13.4 g/dL (ref 13.0–17.0)
Immature Granulocytes: 1 %
Lymphocytes Relative: 6 %
Lymphs Abs: 0.9 10*3/uL (ref 0.7–4.0)
MCH: 25.8 pg — ABNORMAL LOW (ref 26.0–34.0)
MCHC: 30.7 g/dL (ref 30.0–36.0)
MCV: 84.2 fL (ref 80.0–100.0)
Monocytes Absolute: 0.8 10*3/uL (ref 0.1–1.0)
Monocytes Relative: 6 %
Neutro Abs: 13.1 10*3/uL — ABNORMAL HIGH (ref 1.7–7.7)
Neutrophils Relative %: 86 %
Platelets: 269 10*3/uL (ref 150–400)
RBC: 5.19 MIL/uL (ref 4.22–5.81)
RDW: 15.9 % — ABNORMAL HIGH (ref 11.5–15.5)
WBC: 15.2 10*3/uL — ABNORMAL HIGH (ref 4.0–10.5)
nRBC: 0 % (ref 0.0–0.2)

## 2023-06-04 LAB — BASIC METABOLIC PANEL
Anion gap: 9 (ref 5–15)
BUN: 14 mg/dL (ref 6–20)
CO2: 26 mmol/L (ref 22–32)
Calcium: 8.7 mg/dL — ABNORMAL LOW (ref 8.9–10.3)
Chloride: 102 mmol/L (ref 98–111)
Creatinine, Ser: 1.49 mg/dL — ABNORMAL HIGH (ref 0.61–1.24)
GFR, Estimated: 56 mL/min — ABNORMAL LOW (ref >60)
Glucose, Bld: 106 mg/dL — ABNORMAL HIGH (ref 70–99)
Potassium: 3.7 mmol/L (ref 3.5–5.1)
Sodium: 137 mmol/L (ref 135–145)

## 2023-06-04 MED ORDER — CEPHALEXIN 500 MG PO CAPS: 500.0000 mg | ORAL_CAPSULE | Freq: Four times a day (QID) | ORAL | 0 refills | Status: AC

## 2023-06-04 MED ORDER — CEFAZOLIN SODIUM-DEXTROSE 2-4 GM/100ML-% IV SOLN
2.0000 g | Freq: Once | INTRAVENOUS | Status: AC
  Administered 2023-06-04: 2 g via INTRAVENOUS
  Filled 2023-06-04: qty 100

## 2023-06-04 MED ORDER — HYDROCODONE-ACETAMINOPHEN 5-325 MG PO TABS: ORAL_TABLET | ORAL | 0 refills | Status: AC

## 2023-06-04 MED ORDER — OXYCODONE-ACETAMINOPHEN 5-325 MG PO TABS
1.0000 | ORAL_TABLET | Freq: Once | ORAL | Status: AC
  Administered 2023-06-04: 1 via ORAL
  Filled 2023-06-04: qty 1

## 2023-06-04 NOTE — ED Provider Notes (Signed)
[place] EMERGENCY DEPARTMENT AT [facility] Provider Note   CSN: [id number] Arrival date & time: 06/04/23  4098     History  Chief Complaint  Patient presents with   Leg Pain    [name] is a 52 y.o. male.   Leg Pain Associated symptoms: fever        [name] is a 52 y.o. male who presents to the Emergency Department complaining of pain redness and swelling of his right lower leg.  Symptoms gradually worsening for the last several days.  Febrile yesterday, but .  History of traumatic injury to the right lower leg in 2020 that required reported surgical debridement of the wound.  He endorses having intermittent infections over the lower leg, states he is having increased swelling and redness of his leg for the last several days that is different to previous.  He describes pain radiating from his anterior leg into his calf and up the backside of his thigh.  Pain worse with palpation and weightbearing.  Denies any chest pain, cough or shortness of breath.  Mild fever yesterday, but none today.  Has not taken any fever reducing medications yesterday or today.  No respiratory symptoms.  No history of diabetes or prior DVT.  Home Medications Prior to Admission medications   Medication Sig Start Date End Date Taking? Authorizing Provider  amLODipine (NORVASC) 10 MG tablet TAKE 1 TABLET (10 MG TOTAL) BY MOUTH DAILY. 01/17/13   Dunn, Raymon Mutton, PA-C  amLODipine (NORVASC) 10 MG tablet TAKE 1 TABLET (10 MG TOTAL) BY MOUTH DAILY. 05/10/13   Weber, Dema Severin, PA-C  hydrochlorothiazide (HYDRODIURIL) 25 MG tablet Take 25 mg by mouth daily.    [provider]  methocarbamol (ROBAXIN) 500 MG tablet Take 1 tablet (500 mg total) by mouth 2 (two) times daily. 05/11/22   Leath-Warren, Sadie Haber, NP  Multiple Vitamin (MULTIVITAMIN) capsule Take 1 capsule by mouth daily.      [provider]  valsartan (DIOVAN) 320 MG tablet Take 320 mg by mouth daily.    [provider]      Allergies    Patient has no known allergies.    Review of Systems   Review of Systems  Constitutional:  Positive for fever. Negative for chills.  Respiratory:  Negative for cough and shortness of breath.   Cardiovascular:  Negative for chest pain.  Gastrointestinal:  Negative for abdominal pain, diarrhea, nausea and vomiting.  Musculoskeletal:  Positive for myalgias.       Pain redness and swelling right lower extremity  Skin:  Positive for color change.  Neurological:  Negative for weakness and numbness.    Physical Exam Updated Vital Signs BP (!) 182/93 (BP Location: Left Wrist)   Pulse 89   Temp 98.4 F (36.9 C)   Resp 18   Ht 6' (1.829 m)   Wt (!) 171.5 kg   SpO2 96%   BMI 51.27 kg/m  Physical Exam Vitals and nursing note reviewed.  Constitutional:      General: He is not in acute distress.    Appearance: Normal appearance. He is not ill-appearing or toxic-appearing.  Cardiovascular:     Rate and Rhythm: Normal rate and regular rhythm.     Pulses: Normal pulses.  Pulmonary:     Effort: Pulmonary effort is normal.  Musculoskeletal:        General: Swelling and tenderness present.     Left lower leg: Swelling and tenderness present.  Comments: Diffuse tenderness to palpation in her right lower extremity, pain at posterior lower leg.  No palpable cord.  Lower leg is warm to touch, erythematous and skin is shiny.  Skin:    General: Skin is warm.     Capillary Refill: Capillary refill takes less than 2 seconds.     Findings: Erythema present.  Neurological:     General: No focal deficit present.     Mental Status: He is alert.     Sensory: No sensory deficit.     Motor: No weakness.     ED Results / Procedures / Treatments   Labs (all labs ordered are listed, but only abnormal results are displayed) Labs Reviewed  CBC WITH DIFFERENTIAL/PLATELET - Abnormal; Notable for the following components:      Result Value   WBC 15.2 (*)    MCH  25.8 (*)    RDW 15.9 (*)    Neutro Abs 13.1 (*)    All other components within normal limits  BASIC METABOLIC PANEL - Abnormal; Notable for the following components:   Glucose, Bld 106 (*)    Creatinine, Ser 1.49 (*)    Calcium 8.7 (*)    GFR, Estimated 56 (*)    All other components within normal limits    EKG None  Radiology US Venous Img Lower Unilateral Right Result Date: 06/04/2023 CLINICAL DATA:  Pain, redness and swelling. EXAM: RIGHT LOWER EXTREMITY VENOUS DOPPLER ULTRASOUND TECHNIQUE: Gray-scale sonography with graded compression, as well as color Doppler and duplex ultrasound were performed to evaluate the lower extremity deep venous systems from the level of the common femoral vein and including the common femoral, femoral, profunda femoral, popliteal and calf veins including the posterior tibial, peroneal and gastrocnemius veins when visible. The superficial great saphenous vein was also interrogated. Spectral Doppler was utilized to evaluate flow at rest and with distal augmentation maneuvers in the common femoral, femoral and popliteal veins. COMPARISON:  None Available. FINDINGS: Contralateral Common Femoral Vein: Respiratory phasicity is normal and symmetric with the symptomatic side. No evidence of thrombus. Normal compressibility. Common Femoral Vein: No evidence of thrombus. Normal compressibility, respiratory phasicity and response to augmentation. Saphenofemoral Junction: No evidence of thrombus. Normal compressibility and flow on color Doppler imaging. Profunda Femoral Vein: No evidence of thrombus. Normal compressibility and flow on color Doppler imaging. Femoral Vein: No evidence of thrombus. Normal compressibility, respiratory phasicity and response to augmentation. Popliteal Vein: No evidence of thrombus. Normal compressibility, respiratory phasicity and response to augmentation. Calf Veins: No evidence of thrombus. Normal compressibility and flow on color Doppler imaging.  Superficial Great Saphenous Vein: No evidence of thrombus. Normal compressibility. Venous Reflux:  None. Other Findings:  None. IMPRESSION: No evidence of deep venous thrombosis. Electronically Signed   By: Layla Maw M.D.   On: 06/04/2023 12:45    Procedures Procedures    Medications Ordered in ED Medications - No data to display  ED Course/ Medical Decision Making/ A&P                                 Medical Decision Making Patient here with pain swelling and redness right lower leg.  Has history of wound to the right lower leg that required surgical debridement.  Endorses fever yesterday.  None today.  No chest pain or shortness of breath.  On my exam, lower extremity is erythematous excessively warm and skin is shiny.  There is  some localized rash to the anterior portion of the mid lower leg.  No drainage or fluctuance pretibial.  He does have posterior leg tenderness from calf up to the thigh.  Exam is concerning for DVT versus cellulitis.  Abscess considered but felt less likely.  There are no open wounds of the leg.  He is neurovascularly intact.  No history of diabetes, he is nontoxic-appearing.  Afebrile here.  Low suspicion for sepsis.  Will check labs and DVT study    Amount and/or Complexity of Data Reviewed Labs: ordered.    Details: Leukocytosis, serum creatinine elevated no recent values available for comparison, creatinine has been elevated in years past Radiology: ordered.    Details: Venous imaging of the extremity without evidence of DVT Discussion of management or test interpretation with external provider(s): Cellulitis of the right lower extremity.  No abscess or open wounds.  Given Ancef here.  Episodic low-grade fever yesterday, no fever today, no antipyretics today.  No tachycardia tachypnea or hypoxia.  Discussed treatment plan, pt prefers close follow-up with PCP oral antibiotics for home.  Prescription for cephalexin short course of pain medication also  provided.  Database was reviewed.  Strict return precautions were also given.  Risk Prescription drug management.           Final Clinical Impression(s) / ED Diagnoses Final diagnoses:  Cellulitis of right lower extremity    Rx / DC Orders ED Discharge Orders     None         Rosey Bath 06/04/23 1522    Gerhard Munch, MD 06/05/23 1120

## 2023-06-04 NOTE — ED Triage Notes (Signed)
Pt c/o leg pain and swelling to right lower leg for the last couple of days

## 2023-06-04 NOTE — Discharge Instructions (Signed)
Elevate your legs when possible.  Minimal standing or walking.  Take the antibiotic as directed until finished.  You have also been prescribed medication for pain.  Please follow-up with your primary care provider in 2 to 3 days for recheck.  As discussed, return to the emergency department if you develop any worsening symptoms such as increasing redness, persistent fever, red streaking or increased swelling of the area.

## 2023-06-15 ENCOUNTER — Ambulatory Visit
Admission: EM | Admit: 2023-06-15 | Discharge: 2023-06-15 | Disposition: A | Discharge status: Home / Self Care | Payer: BC Managed Care – PPO | Attending: Nurse Practitioner | Admitting: Nurse Practitioner

## 2023-06-15 DIAGNOSIS — I890 Lymphedema, not elsewhere classified: Secondary | ICD-10-CM | POA: Diagnosis not present

## 2023-06-15 DIAGNOSIS — S81801A Unspecified open wound, right lower leg, initial encounter: Secondary | ICD-10-CM

## 2023-06-15 MED ORDER — CEFTRIAXONE SODIUM 1 G IJ SOLR
1.0000 g | Freq: Once | INTRAMUSCULAR | Status: AC
  Administered 2023-06-15: 1 g via INTRAMUSCULAR

## 2023-06-15 NOTE — ED Triage Notes (Signed)
 Patient is here for follow-up on right leg pain and swelling. Patient states his leg is draining. Patient saw his PCP and was prescribed antibiotics.

## 2023-06-15 NOTE — ED Provider Notes (Signed)
 [facility]    CSN: [id number] Arrival date & time: 06/15/23  1743      History   Chief Complaint No chief complaint on file.   HPI [name] is a 53 y.o. male.   The history is provided by the patient and the spouse.   Patient presents for follow-up for wound to the right lower extremity.  Patient states symptoms started on 12/23 when he developed redness and swelling of the right lower leg.  Ultrasound of the right lower extremity was performed to rule out DVT.  Patient was started on Keflex  500 mg 4 times daily at that time.  Patient subsequently followed up with his PCP on 12/30, and Bactrim DS was also added to his current medication regimen.  Patient states that over the past several days, he has had increased swelling to the leg.  He states that the wound on the right lower leg opened up and began draining.  He states that he continues to experience swelling to the leg and that he has a continuous burning sensation in the leg.  Patient states that his PCP made a referral to wound care for him today.  Patient with underlying history of right lower extremity lymphadenopathy.  Patient denies prior history of diabetes, although there is a history of non-insulin-dependent diabetes on his chart.  Patient's spouse states that she attempted to wrap the leg, but patient cannot tolerate the leg wrap.  They did speak with the patient's PCP who advised him to restart HCTZ 25 mg and that he could double up on it as needed for swelling.  Patient denies fever, chills, chest pain, abdominal pain, nausea, vomiting, or diarrhea.  Past Medical History:  Diagnosis Date   Hypertension    Obesity (BMI 35.0-39.9 without comorbidity)    OSA (obstructive sleep apnea)    OSA on CPAP    [employer]     Patient Active Problem List   Diagnosis Date Noted   Severe obesity (BMI >= 40) (HCC) 10/29/2013   Accelerated hypertension 10/29/2013   OSA on CPAP    OBSTRUCTIVE SLEEP APNEA  07/09/2007   Essential hypertension 07/09/2007    Past Surgical History:  Procedure Laterality Date   COLONOSCOPY     LAPAROSCOPIC GASTRIC BANDING  03/07/11   NASAL SEPTUM SURGERY         Home Medications    Prior to Admission medications   Medication Sig Start Date End Date Taking? Authorizing Provider  amLODipine  (NORVASC ) 10 MG tablet TAKE 1 TABLET (10 MG TOTAL) BY MOUTH DAILY. 01/17/13   Dunn, Bernardino HERO, PA-C  amLODipine  (NORVASC ) 10 MG tablet TAKE 1 TABLET (10 MG TOTAL) BY MOUTH DAILY. 05/10/13   Weber, Lauraine CROME, PA-C  cephALEXin  (KEFLEX ) 500 MG capsule Take 1 capsule (500 mg total) by mouth 4 (four) times daily. 06/04/23   Triplett, Tammy, PA-C  hydrochlorothiazide (HYDRODIURIL) 25 MG tablet Take 25 mg by mouth daily.    [provider]  HYDROcodone -acetaminophen  (NORCO/VICODIN) 5-325 MG tablet Take one tab po q 4 hrs prn pain 06/04/23   Triplett, Tammy, PA-C  methocarbamol  (ROBAXIN ) 500 MG tablet Take 1 tablet (500 mg total) by mouth 2 (two) times daily. 05/11/22   Leath-Warren, Etta JINNY, NP  Multiple Vitamin (MULTIVITAMIN) capsule Take 1 capsule by mouth daily.      [provider]  valsartan (DIOVAN) 320 MG tablet Take 320 mg by mouth daily.    [provider]    Family History  Family History  Problem Relation Age of Onset   Cancer Paternal Grandmother        [place]   Hyperlipidemia Mother    Diabetes Father     Social History Social History   Tobacco Use   Smoking status: Never   Smokeless tobacco: Never  Substance Use Topics   Alcohol use: No   Drug use: No     Allergies   Patient has no known allergies.   Review of Systems Review of Systems Per HPI  Physical Exam Triage Vital Signs ED Triage Vitals  Encounter Vitals Group     BP      Systolic BP Percentile      Diastolic BP Percentile      Pulse      Resp      Temp      Temp src      SpO2      Weight      Height      Head Circumference      Peak Flow      Pain  Score      Pain Loc      Pain Education      Exclude from Growth Chart    No data found.  Updated Vital Signs There were no vitals taken for this visit.  Visual Acuity Right Eye Distance:   Left Eye Distance:   Bilateral Distance:    Right Eye Near:   Left Eye Near:    Bilateral Near:     Physical Exam Vitals and nursing note reviewed.  Constitutional:      General: He is not in acute distress.    Appearance: Normal appearance.  HENT:     Head: Normocephalic.  Eyes:     Extraocular Movements: Extraocular movements intact.     Pupils: Pupils are equal, round, and reactive to light.  Cardiovascular:     Rate and Rhythm: Normal rate and regular rhythm.     Pulses: Normal pulses.     Heart sounds: Normal heart sounds.  Pulmonary:     Effort: Pulmonary effort is normal.  Musculoskeletal:     Cervical back: Normal range of motion.  Skin:    General: Skin is warm and dry.     Comments: Wound to right lower extremity.  Right lower extremity with moderate erythema and swelling present.  Wound located to the lateral aspect of the right ankle.  Sloughing skin is present.  There is mild oozing of clear drainage from the right lower extremity.  Neurological:     General: No focal deficit present.     Mental Status: He is alert and oriented to person, place, and time.  Psychiatric:        Mood and Affect: Mood normal.        Behavior: Behavior normal.      UC Treatments / Results  Labs (all labs ordered are listed, but only abnormal results are displayed) Labs Reviewed - No data to display  EKG   Radiology No results found.  Procedures Procedures (including critical care time)  Medications Ordered in UC Medications - No data to display  Initial Impression / Assessment and Plan / UC Course  I have reviewed the triage vital signs and the nursing notes.  Pertinent labs & imaging results that were available during my care of the patient were reviewed by me and  considered in my medical decision making (see chart for details).  Patient is currently on Keflex   and Bactrim for a wound to the right lower extremity. Patient continues to experience right lower extremity edema and pain. Rocephin  1 g IM administered, will have patient continue Keflex  and Bactrim.   Patient is currently also taking hydrochlorothiazide for swelling.  Recommended increasing dose of HCTZ to 50 mg for the next 3 days, advised patient to follow-up with PCP if increasing dose of HCTZ is ineffective.  Elevate the right lower extremity as much as possible above the level of the heart, advised cool compresses to help with pain to the right lower extremity.  Referral for wound care was made for the patient, advised patient spouse to follow-up on 06/17/2023 to schedule an appointment as soon as possible.  Also left message for Jerel Hacker regarding helping the patient get an appointment.  Discussed ER follow-up precautions with the patient.  Patient was in agreement with this plan of care and verbalized understanding.  All questions were answered.  Patient stable for discharge.  Called and left voicemail with Rexene Hacker regarding referral.  Patient's information was provided. Final Clinical Impressions(s) / UC Diagnoses   Final diagnoses:  None   Discharge Instructions   None    ED Prescriptions   None    PDMP not reviewed this encounter.   Gilmer Etta PARAS, NP 06/15/23 2007

## 2023-06-15 NOTE — Discharge Instructions (Addendum)
 Continue your current antibiotics at this time.  As discussed, recommend increasing the dose of the hydrochlorothiazide for the next 3 days. May take over-the-counter Tylenol  as needed for pain, fever, or general discomfort. Apply cool compresses to the right lower extremity is much as possible to help with pain and swelling. Elevate the right lower extremity above the level of the heart as much as possible. [facility]  Please call on 06/18/2023 to make an appointment. Go to the emergency department immediately if you experience worsening pain, swelling, or develop new symptoms of fever, chills, or other concerns. Follow-up as needed.

## 2023-08-02 ENCOUNTER — Ambulatory Visit (HOSPITAL_BASED_OUTPATIENT_CLINIC_OR_DEPARTMENT_OTHER): Payer: BC Managed Care – PPO | Admitting: General Surgery
